# Patient Record
Sex: Male | Born: 1935 | Race: White | Hispanic: No | Marital: Married | Smoking: Never smoker
Health system: Southern US, Community
[De-identification: ages and names within clinical notes are randomized; demographics above are authoritative.]

## PROBLEM LIST (undated history)

## (undated) ENCOUNTER — Emergency Department: Payer: Medicare PPO

## (undated) DIAGNOSIS — N400 Enlarged prostate without lower urinary tract symptoms: Secondary | ICD-10-CM

## (undated) DIAGNOSIS — I2510 Atherosclerotic heart disease of native coronary artery without angina pectoris: Secondary | ICD-10-CM

## (undated) DIAGNOSIS — F419 Anxiety disorder, unspecified: Secondary | ICD-10-CM

## (undated) DIAGNOSIS — N481 Balanitis: Secondary | ICD-10-CM

## (undated) DIAGNOSIS — M1990 Unspecified osteoarthritis, unspecified site: Secondary | ICD-10-CM

## (undated) DIAGNOSIS — R3129 Other microscopic hematuria: Secondary | ICD-10-CM

## (undated) DIAGNOSIS — R319 Hematuria, unspecified: Secondary | ICD-10-CM

## (undated) DIAGNOSIS — I10 Essential (primary) hypertension: Secondary | ICD-10-CM

## (undated) DIAGNOSIS — N200 Calculus of kidney: Secondary | ICD-10-CM

## (undated) DIAGNOSIS — N529 Male erectile dysfunction, unspecified: Secondary | ICD-10-CM

## (undated) DIAGNOSIS — Z951 Presence of aortocoronary bypass graft: Secondary | ICD-10-CM

## (undated) DIAGNOSIS — E669 Obesity, unspecified: Secondary | ICD-10-CM

## (undated) DIAGNOSIS — K219 Gastro-esophageal reflux disease without esophagitis: Secondary | ICD-10-CM

## (undated) DIAGNOSIS — H9190 Unspecified hearing loss, unspecified ear: Secondary | ICD-10-CM

## (undated) DIAGNOSIS — R3915 Urgency of urination: Secondary | ICD-10-CM

## (undated) DIAGNOSIS — E291 Testicular hypofunction: Secondary | ICD-10-CM

## (undated) HISTORY — DX: Hematuria, unspecified: R31.9

## (undated) HISTORY — DX: Calculus of kidney: N20.0

## (undated) HISTORY — DX: Benign prostatic hyperplasia without lower urinary tract symptoms: N40.0

## (undated) HISTORY — DX: Testicular hypofunction: E29.1

## (undated) HISTORY — DX: Other microscopic hematuria: R31.29

## (undated) HISTORY — DX: Male erectile dysfunction, unspecified: N52.9

## (undated) HISTORY — DX: Balanitis: N48.1

## (undated) HISTORY — DX: Urgency of urination: R39.15

## (undated) HISTORY — DX: Obesity, unspecified: E66.9

## (undated) HISTORY — PX: LITHOTRIPSY: SUR834

## (undated) HISTORY — DX: Atherosclerotic heart disease of native coronary artery without angina pectoris: I25.10

---

## 2004-08-13 ENCOUNTER — Ambulatory Visit: Payer: Self-pay | Admitting: Internal Medicine

## 2005-04-30 ENCOUNTER — Ambulatory Visit: Payer: Self-pay | Admitting: Internal Medicine

## 2005-05-13 ENCOUNTER — Ambulatory Visit: Payer: Self-pay | Admitting: Cardiovascular Disease

## 2006-10-06 ENCOUNTER — Ambulatory Visit: Payer: Self-pay | Admitting: Gastroenterology

## 2007-06-04 ENCOUNTER — Encounter: Admission: RE | Admit: 2007-06-04 | Discharge: 2007-06-04 | Payer: Self-pay | Admitting: Family Medicine

## 2008-10-27 ENCOUNTER — Ambulatory Visit: Payer: Self-pay | Admitting: Gastroenterology

## 2009-09-21 HISTORY — PX: CORONARY ARTERY BYPASS GRAFT: SHX141

## 2010-06-25 ENCOUNTER — Ambulatory Visit: Payer: Self-pay | Admitting: Cardiovascular Disease

## 2010-09-18 ENCOUNTER — Encounter: Payer: Self-pay | Admitting: Cardiovascular Disease

## 2010-09-21 ENCOUNTER — Encounter: Payer: Self-pay | Admitting: Cardiovascular Disease

## 2010-10-22 ENCOUNTER — Encounter: Payer: Self-pay | Admitting: Cardiovascular Disease

## 2010-10-27 ENCOUNTER — Observation Stay: Payer: Self-pay | Admitting: Internal Medicine

## 2010-11-20 ENCOUNTER — Encounter: Payer: Self-pay | Admitting: Cardiovascular Disease

## 2010-12-21 ENCOUNTER — Encounter: Payer: Self-pay | Admitting: Cardiovascular Disease

## 2014-01-25 ENCOUNTER — Ambulatory Visit: Payer: Self-pay | Admitting: Urology

## 2014-01-29 ENCOUNTER — Ambulatory Visit: Payer: Self-pay | Admitting: Urology

## 2014-02-20 ENCOUNTER — Ambulatory Visit: Payer: Self-pay | Admitting: Urology

## 2014-03-27 ENCOUNTER — Ambulatory Visit: Payer: Self-pay | Admitting: Urology

## 2014-05-02 ENCOUNTER — Ambulatory Visit: Payer: Self-pay | Admitting: Urology

## 2014-05-02 LAB — BASIC METABOLIC PANEL
Anion Gap: 6 — ABNORMAL LOW (ref 7–16)
BUN: 18 mg/dL (ref 7–18)
CALCIUM: 8.6 mg/dL (ref 8.5–10.1)
CREATININE: 1.33 mg/dL — AB (ref 0.60–1.30)
Chloride: 107 mmol/L (ref 98–107)
Co2: 28 mmol/L (ref 21–32)
EGFR (Non-African Amer.): 51 — ABNORMAL LOW
GFR CALC AF AMER: 59 — AB
Glucose: 95 mg/dL (ref 65–99)
Osmolality: 283 (ref 275–301)
Potassium: 4.1 mmol/L (ref 3.5–5.1)
SODIUM: 141 mmol/L (ref 136–145)

## 2014-05-02 LAB — CBC
HCT: 41.6 % (ref 40.0–52.0)
HGB: 13.9 g/dL (ref 13.0–18.0)
MCH: 31.3 pg (ref 26.0–34.0)
MCHC: 33.3 g/dL (ref 32.0–36.0)
MCV: 94 fL (ref 80–100)
Platelet: 146 10*3/uL — ABNORMAL LOW (ref 150–440)
RBC: 4.43 10*6/uL (ref 4.40–5.90)
RDW: 14.7 % — ABNORMAL HIGH (ref 11.5–14.5)
WBC: 6.1 10*3/uL (ref 3.8–10.6)

## 2014-05-14 ENCOUNTER — Ambulatory Visit: Payer: Self-pay | Admitting: Urology

## 2014-08-13 ENCOUNTER — Emergency Department: Payer: Self-pay | Admitting: Emergency Medicine

## 2015-01-12 NOTE — Op Note (Signed)
PATIENT NAME:  Jeffery Ward, Menashe L MR#:  [id number] DATE OF BIRTH:  [birth year]  DATE OF PROCEDURE:  05/14/2014  SURGEON: Gerlene Burdockichard D. Edwyna ShellHart, DO.   PREOPERATIVE DIAGNOSIS:  Distal right ureteral calculus.   POSTOPERATIVE DIAGNOSIS: Distal right ureteral calculus.   PROCEDURE:  1. Cystoscopy. 2. Right ureteroscopy.  3. Laser lithotripsy.  4. Calculus removal.   COMPLICATIONS: None.   ANESTHESIA: General.   SPECIMEN: Calculus fragment.  DESCRIPTION OF PROCEDURE: With the patient sterilely prepped and draped in supine lithotomy position for ease of approach to the external genitalia and after an appropriate timeout, I begin the procedure. Cystoscopy is done. 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 There is no lateral lobe hypertrophy of the prostate. Then the bladder itself shows quite a bit of trabeculation. The ureters however, are easily seen in a normal position on the interureteric ridge and they are normal in configuration. The right ureter is seen with fluoroscopy to have a distal calculus present. It was seen on CAT scan. So, I instrument it with a 0.035 Glidewire easily past the calculus. Then do not have to do a retrograde pyelogram. I do a cystoscopy and there in the distal ureter is a rounded 3, 5 or 4 mm calculus. There is no impaction. So, I gently eased the calculus back into the dilated portion of the ureter, and then disintegrated it. The largest fragment was removed with a 4-wire Nitinol basket without difficulty.   The patient tolerated the procedure well. There is minimal bleeding, even though he has been on chronic Plavix; although, off it now for the last week. He has also been on Aleve as a substitute for his Plavix. He will begin his Plavix again in 48 hours. Bladder is emptied and 30 mL of 0.5% Marcaine are put in through the 22 French cystoscope sheath. The Marcaine is 30 mL of 0.5% plain  Marcaine. The B and O suppository is placed in the rectum. Rectal exam reveals no tumors, masses, gross nodularity of the prostate or rectal masses    ____________________________ Caralyn Guileichard D. Edwyna ShellHart, DO rdh:NT D: 05/14/2014 09:29:47 ET T: 05/14/2014 16:51:32 ET JOB#: [id number]  cc: Caralyn Guileichard D. Edwyna ShellHart, DO, <Dictator> Amarilys Lyles D Marrianne Sica DO ELECTRONICALLY SIGNED 05/18/2014 14:15

## 2015-05-31 ENCOUNTER — Other Ambulatory Visit: Payer: Self-pay

## 2015-05-31 DIAGNOSIS — E291 Testicular hypofunction: Secondary | ICD-10-CM

## 2015-06-01 LAB — TESTOSTERONE: TESTOSTERONE: 199 ng/dL — AB (ref 348–1197)

## 2015-06-03 ENCOUNTER — Telehealth: Payer: Self-pay | Admitting: *Deleted

## 2015-06-03 NOTE — Telephone Encounter (Signed)
Spoke with patient to tell him that i had received his approval for Testopel. Patient was scheduled an appointment for procedure for Sept. 16 at 10:45 am. Patient states that was fine.

## 2015-06-06 ENCOUNTER — Encounter: Payer: Self-pay | Admitting: *Deleted

## 2015-06-06 ENCOUNTER — Other Ambulatory Visit: Payer: Self-pay | Admitting: *Deleted

## 2015-06-07 ENCOUNTER — Encounter: Payer: Self-pay | Admitting: Urology

## 2015-06-07 ENCOUNTER — Ambulatory Visit (INDEPENDENT_AMBULATORY_CARE_PROVIDER_SITE_OTHER): Payer: Medicare Other | Admitting: Urology

## 2015-06-07 VITALS — BP 133/74 | HR 50 | Ht 68.0 in | Wt 232.3 lb

## 2015-06-07 DIAGNOSIS — N401 Enlarged prostate with lower urinary tract symptoms: Secondary | ICD-10-CM | POA: Diagnosis not present

## 2015-06-07 DIAGNOSIS — E291 Testicular hypofunction: Secondary | ICD-10-CM | POA: Diagnosis not present

## 2015-06-07 DIAGNOSIS — N138 Other obstructive and reflux uropathy: Secondary | ICD-10-CM

## 2015-06-07 NOTE — Progress Notes (Signed)
06/07/2015 4:02 PM   [name] [birth year] [id number]  Referring provider: No referring provider defined for this encounter.  Chief Complaint  Patient presents with  . Hypogonadism    follow up    HPI: This is 81 year old white male with a history of hypogonadism and BPH with LUTS who presents today for 6 month follow-up.  BPH WITH LUTS His IPSS score today is 16, which is moderate lower urinary tract symptomatology. He is mixed with his quality life due to his urinary symptoms. His major complaint today frequency and urgency.  He has had these symptoms for many years.  He denies any dysuria, hematuria or suprapubic pain.  He also denies any recent fevers, chills, nausea or vomiting.  He does not have a family history of PCa.      IPSS      06/07/15 1100       International Prostate Symptom Score   How often have you had the sensation of not emptying your bladder? Not at All     How often have you had to urinate less than every two hours? Almost always     How often have you found you stopped and started again several times when you urinated? About half the time     How often have you found it difficult to postpone urination? Almost always     How often have you had a weak urinary stream? Less than 1 in 5 times     How often have you had to strain to start urination? Not at All     How many times did you typically get up at night to urinate? 2 Times     Total IPSS Score 16     Quality of Life due to urinary symptoms   If you were to spend the rest of your life with your urinary condition just the way it is now how would you feel about that? Mixed        Score:  1-7 Mild 8-19 Moderate 20-35 Severe  Hypogonadism Patient is  with the symptoms of reduced libido and erectile dysfunction.  He is also experiencing a reduced incidence of spontaneous erections, a reduced need to shave, an increase of body fat, a decrease in physical and work performance, disturbances in  sleep patterns, decreased energy and motivation.  This is indicated by his responses to the ADAM questionnaire.      Androgen Deficiency in the Aging Male      06/07/15 1100       Androgen Deficiency in the Aging Male   Do you have a decrease in libido (sex drive) Yes     Do you have lack of energy Yes     Do you have a decrease in strength and/or endurance Yes     Have you lost height Yes     Have you noticed a decreased "enjoyment of life" No     Are you sad and/or grumpy No     Are your erections less strong Yes     Have you noticed a recent deterioration in your ability to play sports Yes     Are you falling asleep after dinner Yes     Has there been a recent deterioration in your work performance No         His pretreatment testosterone level was 186 ng/dL on 40/98/1191.   His hypogonadism is currently being managed by Testopel insertion.     PMH: Past Medical History  Diagnosis Date  . Coronary artery arteriosclerosis   . Erectile dysfunction   . Hematuria   . Hypogonadism in male   . BPH without obstruction/lower urinary tract symptoms   . Microscopic hematuria   . Balanitis   . Obesity   . Urinary urgency   . Kidney stones     Surgical History: Past Surgical History  Procedure Laterality Date  . Coronary artery bypass graft  2011    Home Medications:    Medication List       This list is accurate as of: 06/07/15 11:59 PM.  Always use your most recent med list.               aspirin 325 MG tablet  Take by mouth.     clopidogrel 75 MG tablet  Commonly known as:  PLAVIX     fluticasone 50 MCG/ACT nasal spray  Commonly known as:  FLONASE     metoprolol succinate 50 MG 24 hr tablet  Commonly known as:  TOPROL-XL     metoprolol tartrate 25 MG tablet  Commonly known as:  LOPRESSOR     oxyCODONE 5 MG immediate release tablet  Commonly known as:  Oxy IR/ROXICODONE     pantoprazole 40 MG tablet  Commonly known as:  PROTONIX  Take by mouth.       rosuvastatin 20 MG tablet  Commonly known as:  CRESTOR  Take by mouth.     telmisartan 20 MG tablet  Commonly known as:  MICARDIS  Take by mouth.     TESTOPEL 75 MG Pllt  Generic drug:  Testosterone  75 mg by Implant route every 3 (three) months.        Allergies:  Allergies  Allergen Reactions  . Amoxicillin Other (See Comments)  . Azithromycin Diarrhea  . Penicillins Other (See Comments)    Family History: Family History  Problem Relation Age of Onset  . Kidney disease Neg Hx   . Prostate cancer Neg Hx   . Bladder Cancer Neg Hx     Social History:  reports that he has never smoked. He does not have any smokeless tobacco history on file. He reports that he does not drink alcohol or use illicit drugs.  ROS: UROLOGY Frequent Urination?: Yes Hard to postpone urination?: Yes Burning/pain with urination?: No Get up at night to urinate?: No Leakage of urine?: No Urine stream starts and stops?: No Trouble starting stream?: No Do you have to strain to urinate?: No Blood in urine?: No Urinary tract infection?: No Sexually transmitted disease?: No Injury to kidneys or bladder?: No Painful intercourse?: No Weak stream?: No Erection problems?: No Penile pain?: No  Gastrointestinal Nausea?: No Vomiting?: No Indigestion/heartburn?: No Diarrhea?: No Constipation?: No  Constitutional Fever: No Night sweats?: No Weight loss?: No Fatigue?: Yes  Skin Skin rash/lesions?: No Itching?: No  Eyes Blurred vision?: No Double vision?: No  Ears/Nose/Throat Sore throat?: No Sinus problems?: No  Hematologic/Lymphatic Swollen glands?: No Easy bruising?: No  Cardiovascular Leg swelling?: No Chest pain?: No  Respiratory Cough?: No Shortness of breath?: No  Endocrine Excessive thirst?: No  Musculoskeletal Back pain?: No Joint pain?: No  Neurological Headaches?: No Dizziness?: No  Psychologic Depression?: No Anxiety?: No  Physical Exam: BP  133/74 mmHg  Pulse 50  Ht  (1.727 m)  Wt 232 lb 4.8 oz (105.371 kg)  BMI 35.33 kg/m2  GU: Patient with circumcised phallus.   Urethral meatus is patent.  No penile discharge. No penile  lesions or rashes. Scrotum without lesions, cysts, rashes and/or edema.  Testicles are located scrotally bilaterally. No masses are appreciated in the testicles. Left and right epididymis are normal. Rectal: Patient with  normal sphincter tone. Perineum without scarring or rashes. No rectal masses are appreciated. Prostate is approximately 45 grams, no nodules are appreciated. Seminal vesicles are normal.   Laboratory Data: Lab Results  Component Value Date   WBC 6.1 05/02/2014   HGB 13.9 05/02/2014   HCT 41.5 06/07/2015   MCV 94 05/02/2014   PLT 146* 05/02/2014    Lab Results  Component Value Date   CREATININE 1.33* 05/02/2014   PSA History:  1.6 ng/mL on 11/01/2012  1.0 ng/mL on 03/22/2013  1.9 ng/mL on 10/10/2013  1.4 ng/mL on 01/16/2014    1.3 ng/mL on 11/15/2014  Lab Results  Component Value Date   PSA 1.2 06/07/2015    Lab Results  Component Value Date   TESTOSTERONE 199* 05/31/2015    Assessment & Plan:    1. Hypogonadism in male:   Patient's hypogonadism is managed with Testopel insertions. He'll be receiving 6 pellets every 3 months. His next insertion is due next week. He will have his hematocrit and has serum testosterone drawn before 9 AM every 6 months.  - Hematocrit  2. BPH with LUTS:   Patient's IPSS score is 16/3.  His DRE demonstrates benign enlargement.  I discussed the treatment options for BPH, such as: observation over time with prn avoidance of alcohol/caffeine; medical treatment or TURP.  Patient would like to continue with observation at this time.   He will follow up in 6 months for a PSA, DRE and an IPSS score.   - PSA    No Follow-up on file.  Michiel Cowboy, PA-C  [facility] [address], [address] [place], [place] [address] [phone]

## 2015-06-08 LAB — HEMATOCRIT: HEMATOCRIT: 41.5 % (ref 37.5–51.0)

## 2015-06-08 LAB — PSA: PROSTATE SPECIFIC AG, SERUM: 1.2 ng/mL (ref 0.0–4.0)

## 2015-06-09 ENCOUNTER — Encounter: Payer: Self-pay | Admitting: Urology

## 2015-06-09 DIAGNOSIS — N401 Enlarged prostate with lower urinary tract symptoms: Secondary | ICD-10-CM | POA: Insufficient documentation

## 2015-06-09 DIAGNOSIS — N138 Other obstructive and reflux uropathy: Secondary | ICD-10-CM | POA: Insufficient documentation

## 2015-06-09 DIAGNOSIS — E291 Testicular hypofunction: Secondary | ICD-10-CM | POA: Insufficient documentation

## 2015-06-10 ENCOUNTER — Telehealth: Payer: Self-pay

## 2015-06-10 NOTE — Telephone Encounter (Signed)
-----   Message from Harle Battiest, PA-C sent at 06/09/2015  4:19 PM EDT ----- Labs are stable. Proceed with Testopel.

## 2015-06-11 ENCOUNTER — Encounter: Payer: Self-pay | Admitting: Urology

## 2015-06-11 ENCOUNTER — Ambulatory Visit (INDEPENDENT_AMBULATORY_CARE_PROVIDER_SITE_OTHER): Payer: Medicare Other | Admitting: Urology

## 2015-06-11 VITALS — BP 117/63 | HR 62 | Ht 68.0 in | Wt 233.8 lb

## 2015-06-11 DIAGNOSIS — E291 Testicular hypofunction: Secondary | ICD-10-CM

## 2015-06-11 MED ORDER — TESTOSTERONE 75 MG IL PLLT
75.0000 mg | PELLET | Freq: Once | Status: AC
  Administered 2015-06-11: 75 mg

## 2015-06-11 NOTE — Progress Notes (Signed)
This is a 81 -year-old male with hypogonadism and he is managed with Testopel. He presents today for Testopel insertion.  Patient is placed on the exam table in the left lateral jackknife position.  Identified upper outer quadrant of hip for insertion; prepped area with Betadine and injected 15 cc's of Lidocaine 1% with Epinephrine to anesthetize superficially and distally along trocar tract.  Made 3 mm incision using 15 blade of scalpel; trocar with sharp ended stylet was inserted into subcutaneous tissue in line with femur. Sharp stylet was withdrawn and 6 pellets were placed into trocar well. Testopel pellets advanced into tissue using blunt ended stylet. Trocar removed and incision closed using 6 Steri-Strips. Cleansed area to remove Betadine and covered Steri-Strips with outer Band-Aid.  Careful inspection of insertion is done and patient informed of post procedure instructions.  He will return in three month for serum testosterone before 9:00am.

## 2015-07-02 ENCOUNTER — Telehealth: Payer: Self-pay | Admitting: *Deleted

## 2015-07-02 NOTE — Telephone Encounter (Signed)
LMOM for patient to call back, I have info for him about the Testopel billing and the bill he received from his insurance.

## 2015-07-05 NOTE — Telephone Encounter (Signed)
Spoke with patient about codes for Testopel. Same codes as always used may be more specific but his insurance company approved and agreed those codes and he needs to contact insurance company back and I let him know I would be glad to talk to them again if needed. Patient states he will call on Monday.

## 2015-09-04 ENCOUNTER — Other Ambulatory Visit: Payer: Medicare Other

## 2015-09-10 ENCOUNTER — Ambulatory Visit (INDEPENDENT_AMBULATORY_CARE_PROVIDER_SITE_OTHER): Payer: Medicare Other | Admitting: Urology

## 2015-09-10 ENCOUNTER — Encounter: Payer: Self-pay | Admitting: Urology

## 2015-09-10 VITALS — BP 169/82 | HR 45 | Ht 68.0 in | Wt 239.4 lb

## 2015-09-10 DIAGNOSIS — E291 Testicular hypofunction: Secondary | ICD-10-CM

## 2015-09-10 NOTE — Progress Notes (Signed)
9:43 AM   Jeffery Ward [birth year] [id number]  Referring provider: No referring provider defined for this encounter.  Chief Complaint  Patient presents with  . Hypogonadism    follow up   HPI: This is 81 year old white male with a history of hypogonadism who presents today for a Testopel insertion, but he received a large bill for the insertion services and he does not want to continue this mode of therapy.  He would like to discuss other options for testosterone replacement.  Hypogonadism Patient is  with the symptoms of reduced libido and erectile dysfunction.  He is also experiencing a reduced incidence of spontaneous erections, a reduced need to shave, an increase of body fat, a decrease in physical and work performance, disturbances in sleep patterns, decreased energy and motivation.  His pretreatment testosterone level was 186 ng/dL on 63/87/564309/24/2013.   His hypogonadism is currently being managed by Testopel insertion.  He has been unhappy with this mode of therapy that only because of his bills, but he also did not like the peaks and troughs experienced while on the insertions.   PMH: Past Medical History  Diagnosis Date  . Coronary artery arteriosclerosis   . Erectile dysfunction   . Hematuria   . Hypogonadism in male   . BPH without obstruction/lower urinary tract symptoms   . Microscopic hematuria   . Balanitis   . Obesity   . Urinary urgency   . Kidney stones     Surgical History: Past Surgical History  Procedure Laterality Date  . Coronary artery bypass graft  2011  . Lithotripsy      Home Medications:    Medication List       This list is accurate as of: 09/10/15  9:43 AM.  Always use your most recent med list.               aspirin 81 MG EC tablet  Take 81 mg by mouth daily. Swallow whole.     clopidogrel 75 MG tablet  Commonly known as:  PLAVIX     fluticasone 50 MCG/ACT nasal spray  Commonly known as:  FLONASE     metoprolol succinate  50 MG 24 hr tablet  Commonly known as:  TOPROL-XL  Reported on 09/10/2015     metoprolol tartrate 25 MG tablet  Commonly known as:  LOPRESSOR     neomycin-polymyxin b-dexamethasone 3.5-10000-0.1 Susp  Commonly known as:  MAXITROL  Reported on 09/10/2015     oxyCODONE 5 MG immediate release tablet  Commonly known as:  Oxy IR/ROXICODONE  Reported on 09/10/2015     pantoprazole 40 MG tablet  Commonly known as:  PROTONIX  Take by mouth.     rosuvastatin 20 MG tablet  Commonly known as:  CRESTOR  Take by mouth.     telmisartan 20 MG tablet  Commonly known as:  MICARDIS  Take by mouth.     terbinafine 250 MG tablet  Commonly known as:  LAMISIL     TESTOPEL 75 MG Pllt  Generic drug:  Testosterone  75 mg by Implant route every 3 (three) months.        Allergies:  Allergies  Allergen Reactions  . Amoxicillin Other (See Comments)  . Azithromycin Diarrhea  . Penicillins Other (See Comments)    Family History: Family History  Problem Relation Age of Onset  . Kidney disease Neg Hx   . Prostate cancer Neg Hx   . Bladder Cancer Neg Hx  Social History:  reports that he has never smoked. He does not have any smokeless tobacco history on file. He reports that he does not drink alcohol or use illicit drugs.  ROS: UROLOGY Frequent Urination?: Yes Hard to postpone urination?: Yes Burning/pain with urination?: No Get up at night to urinate?: Yes Leakage of urine?: No Urine stream starts and stops?: No Trouble starting stream?: No Do you have to strain to urinate?: No Blood in urine?: No Urinary tract infection?: No Sexually transmitted disease?: No Injury to kidneys or bladder?: No Painful intercourse?: No Weak stream?: No Erection problems?: No Penile pain?: No  Gastrointestinal Nausea?: No Vomiting?: No Indigestion/heartburn?: No Diarrhea?: No Constipation?: No  Constitutional Fever: No Night sweats?: No Weight loss?: No Fatigue?: No  Skin Skin  rash/lesions?: No Itching?: No  Eyes Blurred vision?: No Double vision?: No  Ears/Nose/Throat Sore throat?: No Sinus problems?: Yes  Hematologic/Lymphatic Swollen glands?: No Easy bruising?: No  Cardiovascular Leg swelling?: No Chest pain?: No  Respiratory Cough?: No Shortness of breath?: No  Endocrine Excessive thirst?: No  Musculoskeletal Back pain?: Yes Joint pain?: Yes  Neurological Headaches?: Yes Dizziness?: No  Psychologic Depression?: Yes Anxiety?: No  Physical Exam: BP 169/82 mmHg  Pulse 45  Ht  (1.727 m)  Wt 239 lb 6.4 oz (108.591 kg)  BMI 36.41 kg/m2  Constitutional: Well nourished. Alert and oriented, No acute distress. HEENT: [place] AT, moist mucus membranes. Trachea midline, no masses. Cardiovascular: No clubbing, cyanosis, or edema. Respiratory: Normal respiratory effort, no increased work of breathing. Skin: No rashes, bruises or suspicious lesions. Lymph: No cervical or inguinal adenopathy. Neurologic: Grossly intact, no focal deficits, moving all 4 extremities. Psychiatric: Normal mood and affect.   Laboratory Data:  PSA History:  1.6 ng/mL on 11/01/2012  1.0 ng/mL on 03/22/2013  1.9 ng/mL on 10/10/2013  1.4 ng/mL on 01/16/2014    1.3 ng/mL on 11/15/2014  Lab Results  Component Value Date   PSA 1.2 06/07/2015    Lab Results  Component Value Date   TESTOSTERONE 199* 05/31/2015    Assessment & Plan:    1. Hypogonadism in male:   I discussed with the patient the other mode of testosterone therapies, such as gels, injections, buccal mucosal systems, nasal spray and patches.   I also discussed that some men have had success using clomid for hypogonadism.  It does seem to be more successful in younger men, but there are incidences of good results in middle-aged men.  I explained that it is used in male infertility to stimulate the testicles to make more testosterone/sperm.  There has been no long term data on side  effects, but some urologists has been having success with this medication.  He would like to try Clomid therapy.  I reemphasized the side effects of testosterone therapy, such as: enlargement of the prostate gland that may in turn cause LUTS, possible increased risk of PCa, DVT's and/or PE's, possible increased risk of heart attack or stroke, lower sperm count, swelling of the ankles, feet, or body, with or without heart failure, enlarged or painful breasts, have problems breathing while you sleep (sleep apnea), increased prostate specific antigen, mood swings, hypertension and increased red blood cell count.  If his laboratory values return within safe parameters, I will send a prescription for Clomid 50 mg one half tablet daily to his pharmacy, [facility].  - HCT - HEPATIC FUNCTION PANEL - LIPID PANEL - TESTOSTERONE - TSH  If he is initiated on Clomid therapy,  he will return in 1 month's time for a morning testosterone drawn before 9 AM.  Return for pending labs.  Michiel Cowboy, PA-C  [facility] [address], [address] [place], [place] [address] [phone]

## 2015-09-11 ENCOUNTER — Telehealth: Payer: Self-pay

## 2015-09-11 DIAGNOSIS — E291 Testicular hypofunction: Secondary | ICD-10-CM

## 2015-09-11 LAB — TSH: TSH: 2.2 u[IU]/mL (ref 0.450–4.500)

## 2015-09-11 LAB — TESTOSTERONE: TESTOSTERONE: 328 ng/dL — AB (ref 348–1197)

## 2015-09-11 LAB — HEPATIC FUNCTION PANEL
ALT: 20 IU/L (ref 0–44)
AST: 27 IU/L (ref 0–40)
Albumin: 4.3 g/dL (ref 3.5–4.7)
Alkaline Phosphatase: 40 IU/L (ref 39–117)
Bilirubin Total: 0.5 mg/dL (ref 0.0–1.2)
Bilirubin, Direct: 0.14 mg/dL (ref 0.00–0.40)
Total Protein: 6.2 g/dL (ref 6.0–8.5)

## 2015-09-11 LAB — HEMATOCRIT: HEMATOCRIT: 43.5 % (ref 37.5–51.0)

## 2015-09-11 MED ORDER — CLOMIPHENE CITRATE 50 MG PO TABS: ORAL_TABLET | ORAL | Status: DC

## 2015-09-11 NOTE — Telephone Encounter (Signed)
Spoke with pt in reference to medication and labs. Pt voiced understanding. Medication was sent to pharmacy.

## 2015-09-11 NOTE — Telephone Encounter (Signed)
-----   Message from Harle BattiestShannon A McGowan, PA-C sent at 09/11/2015  1:51 PM EST ----- Patient's labs are within normal parameters.  He can start Clomid 50 mg, 1/2 tablet daily #30.  We will need to check a morning testosterone in three months.  His pharmacy is [facility].

## 2015-09-24 ENCOUNTER — Telehealth: Payer: Self-pay | Admitting: Urology

## 2015-09-24 NOTE — Telephone Encounter (Signed)
LMOM

## 2015-09-24 NOTE — Telephone Encounter (Signed)
Patient has not had his lipids checked.  He was initiated on Clomid and will need a morning testosterone level in one month, we can draw his lipids at the same time.

## 2015-09-26 NOTE — Telephone Encounter (Signed)
Jeffery Ward spoke with pt.

## 2015-10-01 ENCOUNTER — Other Ambulatory Visit: Payer: Medicare Other

## 2015-10-03 ENCOUNTER — Other Ambulatory Visit: Payer: Medicare Other

## 2015-10-03 DIAGNOSIS — E291 Testicular hypofunction: Secondary | ICD-10-CM

## 2015-10-04 ENCOUNTER — Telehealth: Payer: Self-pay

## 2015-10-04 LAB — LIPID PANEL
CHOL/HDL RATIO: 2.5 ratio (ref 0.0–5.0)
Cholesterol, Total: 134 mg/dL (ref 100–199)
HDL: 54 mg/dL (ref >39)
LDL CALC: 66 mg/dL (ref 0–99)
Triglycerides: 69 mg/dL (ref 0–149)
VLDL Cholesterol Cal: 14 mg/dL (ref 5–40)

## 2015-10-04 LAB — TESTOSTERONE: TESTOSTERONE: 282 ng/dL — AB (ref 348–1197)

## 2015-10-04 NOTE — Telephone Encounter (Signed)
Spoke with pt wife and made aware of lab results.

## 2015-10-04 NOTE — Telephone Encounter (Signed)
-----   Message from Harle BattiestShannon A McGowan, PA-C sent at 10/04/2015  1:03 PM EST ----- Lipids are good.  Still waiting on his testosterone results.

## 2015-10-10 ENCOUNTER — Telehealth: Payer: Self-pay

## 2015-10-10 DIAGNOSIS — E291 Testicular hypofunction: Secondary | ICD-10-CM

## 2015-10-10 NOTE — Telephone Encounter (Signed)
I suggest to continue the Clomid and rechecking his testosterone in the AM in one month.

## 2015-10-10 NOTE — Telephone Encounter (Signed)
Spoke with pt who stated he is still taking his clomid.

## 2015-10-10 NOTE — Telephone Encounter (Signed)
Left message with caregiver to pt to return call.

## 2015-10-10 NOTE — Telephone Encounter (Signed)
-----   Message from Harle Battiest, PA-C sent at 10/09/2015  9:34 PM EST ----- Is patient taking Clomid?

## 2015-10-11 NOTE — Telephone Encounter (Signed)
Spoke with pt and made aware to continue medication and recheck testosterone in 88mo. Pt voiced understanding.

## 2015-11-26 ENCOUNTER — Telehealth: Payer: Self-pay

## 2015-11-26 NOTE — Telephone Encounter (Signed)
Pt pharmacy sent a refill request of clomid. Noted pt has not has labs drawn. Spoke with pt in reference to medication. Pt stated that he is currently disputing his insurance claims with insurance company and his insurance company rep advised him not to get anymore treatment until the claim has been completed. Reinforced with pt not able to refill medication until labs are completed. Pt voiced understanding stating he will call for an appt when he completes the claim.

## 2016-01-15 ENCOUNTER — Telehealth: Payer: Self-pay | Admitting: Urology

## 2016-01-15 NOTE — Telephone Encounter (Signed)
Mr. Mechele Collinlliott called saying [facility] and they informed him the code that was filed for his last Testopel appointment was S0189. The code needs to be changed to J3490 and refiled. Per the representative, [employer] will then reimburse him for the $1,045 he paid. Per the representative, the original filing code didn't include the medication. The phone number he gave for [facility] is: [phone]. Please call the patient if you have questions or concerns.   Pt's ph# [phone] Thank you.

## 2016-01-16 NOTE — Telephone Encounter (Signed)
This is a question for TEPPCO PartnersMichelle or Lisa.

## 2016-01-17 NOTE — Telephone Encounter (Signed)
Can you help?

## 2016-06-01 ENCOUNTER — Ambulatory Visit
Admission: RE | Admit: 2016-06-01 | Discharge: 2016-06-01 | Disposition: A | Discharge status: Home / Self Care | Payer: Medicare Other | Source: Ambulatory Visit | Attending: Chiropractor | Admitting: Chiropractor

## 2016-06-01 ENCOUNTER — Other Ambulatory Visit: Payer: Self-pay | Admitting: Chiropractor

## 2016-06-01 DIAGNOSIS — R42 Dizziness and giddiness: Secondary | ICD-10-CM | POA: Diagnosis present

## 2016-06-01 DIAGNOSIS — M47894 Other spondylosis, thoracic region: Secondary | ICD-10-CM | POA: Diagnosis not present

## 2016-06-01 DIAGNOSIS — R51 Headache: Secondary | ICD-10-CM | POA: Diagnosis present

## 2016-06-01 DIAGNOSIS — M4184 Other forms of scoliosis, thoracic region: Secondary | ICD-10-CM | POA: Diagnosis not present

## 2019-02-02 ENCOUNTER — Other Ambulatory Visit: Payer: Self-pay | Admitting: Unknown Physician Specialty

## 2019-02-02 ENCOUNTER — Ambulatory Visit
Admission: RE | Admit: 2019-02-02 | Discharge: 2019-02-02 | Disposition: A | Discharge status: Home / Self Care | Payer: Medicare Other | Source: Ambulatory Visit | Attending: Unknown Physician Specialty | Admitting: Unknown Physician Specialty

## 2019-02-02 ENCOUNTER — Other Ambulatory Visit: Payer: Self-pay

## 2019-02-02 DIAGNOSIS — R05 Cough: Secondary | ICD-10-CM | POA: Insufficient documentation

## 2019-02-02 DIAGNOSIS — R059 Cough, unspecified: Secondary | ICD-10-CM

## 2019-02-07 ENCOUNTER — Institutional Professional Consult (permissible substitution): Payer: Medicare Other | Admitting: Internal Medicine

## 2019-02-09 ENCOUNTER — Institutional Professional Consult (permissible substitution): Payer: Medicare Other | Admitting: Internal Medicine

## 2019-06-08 DIAGNOSIS — M4316 Spondylolisthesis, lumbar region: Secondary | ICD-10-CM | POA: Insufficient documentation

## 2021-09-11 ENCOUNTER — Other Ambulatory Visit: Payer: Self-pay | Admitting: Family Medicine

## 2021-09-11 DIAGNOSIS — M542 Cervicalgia: Secondary | ICD-10-CM

## 2021-09-11 DIAGNOSIS — W19XXXA Unspecified fall, initial encounter: Secondary | ICD-10-CM

## 2021-09-12 ENCOUNTER — Other Ambulatory Visit: Payer: Self-pay

## 2021-09-12 ENCOUNTER — Ambulatory Visit
Admission: RE | Admit: 2021-09-12 | Discharge: 2021-09-12 | Disposition: A | Discharge status: Home / Self Care | Payer: Medicare PPO | Source: Ambulatory Visit | Attending: Family Medicine | Admitting: Family Medicine

## 2021-09-12 DIAGNOSIS — M542 Cervicalgia: Secondary | ICD-10-CM | POA: Diagnosis present

## 2021-09-12 DIAGNOSIS — W19XXXA Unspecified fall, initial encounter: Secondary | ICD-10-CM

## 2021-09-12 DIAGNOSIS — S0990XA Unspecified injury of head, initial encounter: Secondary | ICD-10-CM | POA: Insufficient documentation

## 2021-09-12 DIAGNOSIS — M5030 Other cervical disc degeneration, unspecified cervical region: Secondary | ICD-10-CM | POA: Diagnosis not present

## 2021-09-12 DIAGNOSIS — M47812 Spondylosis without myelopathy or radiculopathy, cervical region: Secondary | ICD-10-CM | POA: Insufficient documentation

## 2021-11-11 ENCOUNTER — Encounter
Admission: EM | Disposition: A | Discharge status: Home / Self Care | Payer: Self-pay | Source: Home / Self Care | Attending: Student in an Organized Health Care Education/Training Program

## 2021-11-11 ENCOUNTER — Other Ambulatory Visit: Payer: Self-pay

## 2021-11-11 ENCOUNTER — Encounter: Payer: Self-pay | Admitting: Emergency Medicine

## 2021-11-11 ENCOUNTER — Observation Stay: Payer: Medicare PPO | Admitting: Anesthesiology

## 2021-11-11 ENCOUNTER — Emergency Department: Payer: Medicare PPO

## 2021-11-11 ENCOUNTER — Observation Stay: Payer: Medicare PPO

## 2021-11-11 ENCOUNTER — Observation Stay
Admission: EM | Admit: 2021-11-11 | Discharge: 2021-11-13 | Disposition: A | Discharge status: Home / Self Care | Payer: Medicare PPO | Attending: Internal Medicine | Admitting: Internal Medicine

## 2021-11-11 DIAGNOSIS — E8729 Other acidosis: Secondary | ICD-10-CM | POA: Diagnosis not present

## 2021-11-11 DIAGNOSIS — N200 Calculus of kidney: Secondary | ICD-10-CM | POA: Diagnosis not present

## 2021-11-11 DIAGNOSIS — Z951 Presence of aortocoronary bypass graft: Secondary | ICD-10-CM | POA: Insufficient documentation

## 2021-11-11 DIAGNOSIS — N210 Calculus in bladder: Secondary | ICD-10-CM

## 2021-11-11 DIAGNOSIS — N201 Calculus of ureter: Secondary | ICD-10-CM | POA: Diagnosis not present

## 2021-11-11 DIAGNOSIS — K219 Gastro-esophageal reflux disease without esophagitis: Secondary | ICD-10-CM | POA: Diagnosis present

## 2021-11-11 DIAGNOSIS — I2510 Atherosclerotic heart disease of native coronary artery without angina pectoris: Secondary | ICD-10-CM | POA: Insufficient documentation

## 2021-11-11 DIAGNOSIS — N138 Other obstructive and reflux uropathy: Secondary | ICD-10-CM

## 2021-11-11 DIAGNOSIS — N132 Hydronephrosis with renal and ureteral calculous obstruction: Secondary | ICD-10-CM | POA: Diagnosis not present

## 2021-11-11 DIAGNOSIS — R2681 Unsteadiness on feet: Secondary | ICD-10-CM | POA: Insufficient documentation

## 2021-11-11 DIAGNOSIS — E860 Dehydration: Secondary | ICD-10-CM | POA: Insufficient documentation

## 2021-11-11 DIAGNOSIS — E872 Acidosis, unspecified: Secondary | ICD-10-CM | POA: Diagnosis present

## 2021-11-11 DIAGNOSIS — N1330 Unspecified hydronephrosis: Secondary | ICD-10-CM

## 2021-11-11 DIAGNOSIS — N134 Hydroureter: Secondary | ICD-10-CM | POA: Diagnosis not present

## 2021-11-11 DIAGNOSIS — N23 Unspecified renal colic: Secondary | ICD-10-CM

## 2021-11-11 DIAGNOSIS — Z7982 Long term (current) use of aspirin: Secondary | ICD-10-CM | POA: Insufficient documentation

## 2021-11-11 DIAGNOSIS — N135 Crossing vessel and stricture of ureter without hydronephrosis: Secondary | ICD-10-CM | POA: Insufficient documentation

## 2021-11-11 DIAGNOSIS — N401 Enlarged prostate with lower urinary tract symptoms: Secondary | ICD-10-CM | POA: Diagnosis not present

## 2021-11-11 DIAGNOSIS — R109 Unspecified abdominal pain: Secondary | ICD-10-CM

## 2021-11-11 DIAGNOSIS — R1032 Left lower quadrant pain: Secondary | ICD-10-CM | POA: Diagnosis present

## 2021-11-11 DIAGNOSIS — Z20822 Contact with and (suspected) exposure to covid-19: Secondary | ICD-10-CM | POA: Insufficient documentation

## 2021-11-11 HISTORY — PX: CYSTOSCOPY WITH STENT PLACEMENT: SHX5790

## 2021-11-11 LAB — COMPREHENSIVE METABOLIC PANEL
ALT: 17 U/L (ref 0–44)
AST: 27 U/L (ref 15–41)
Albumin: 4.2 g/dL (ref 3.5–5.0)
Alkaline Phosphatase: 45 U/L (ref 38–126)
Anion gap: 12 (ref 5–15)
BUN: 13 mg/dL (ref 8–23)
CO2: 20 mmol/L — ABNORMAL LOW (ref 22–32)
Calcium: 9.3 mg/dL (ref 8.9–10.3)
Chloride: 105 mmol/L (ref 98–111)
Creatinine, Ser: 1.36 mg/dL — ABNORMAL HIGH (ref 0.61–1.24)
GFR, Estimated: 51 mL/min — ABNORMAL LOW (ref >60)
Glucose, Bld: 162 mg/dL — ABNORMAL HIGH (ref 70–99)
Potassium: 3.6 mmol/L (ref 3.5–5.1)
Sodium: 137 mmol/L (ref 135–145)
Total Bilirubin: 0.7 mg/dL (ref 0.3–1.2)
Total Protein: 7.4 g/dL (ref 6.5–8.1)

## 2021-11-11 LAB — RESP PANEL BY RT-PCR (FLU A&B, COVID) ARPGX2
Influenza A by PCR: NEGATIVE
Influenza B by PCR: NEGATIVE
SARS Coronavirus 2 by RT PCR: NEGATIVE

## 2021-11-11 LAB — URINALYSIS, COMPLETE (UACMP) WITH MICROSCOPIC
Bacteria, UA: NONE SEEN
Bilirubin Urine: NEGATIVE
Glucose, UA: NEGATIVE mg/dL
Hgb urine dipstick: NEGATIVE
Ketones, ur: 5 mg/dL — AB
Nitrite: NEGATIVE
Protein, ur: 100 mg/dL — AB
Specific Gravity, Urine: 1.023 (ref 1.005–1.030)
Squamous Epithelial / HPF: NONE SEEN (ref 0–5)
pH: 6 (ref 5.0–8.0)

## 2021-11-11 LAB — CBC
HCT: 38.9 % — ABNORMAL LOW (ref 39.0–52.0)
Hemoglobin: 13 g/dL (ref 13.0–17.0)
MCH: 30.6 pg (ref 26.0–34.0)
MCHC: 33.4 g/dL (ref 30.0–36.0)
MCV: 91.5 fL (ref 80.0–100.0)
Platelets: 198 10*3/uL (ref 150–400)
RBC: 4.25 MIL/uL (ref 4.22–5.81)
RDW: 13.8 % (ref 11.5–15.5)
WBC: 5.6 10*3/uL (ref 4.0–10.5)
nRBC: 0 % (ref 0.0–0.2)

## 2021-11-11 LAB — LACTIC ACID, PLASMA: Lactic Acid, Venous: 2.7 mmol/L (ref 0.5–1.9)

## 2021-11-11 LAB — TROPONIN I (HIGH SENSITIVITY): Troponin I (High Sensitivity): 7 ng/L (ref <18)

## 2021-11-11 LAB — LIPASE, BLOOD: Lipase: 30 U/L (ref 11–51)

## 2021-11-11 SURGERY — CYSTOSCOPY, WITH STENT INSERTION
Anesthesia: General | Laterality: Left

## 2021-11-11 MED ORDER — LIDOCAINE HCL (CARDIAC) PF 100 MG/5ML IV SOSY
PREFILLED_SYRINGE | INTRAVENOUS | Status: DC | PRN
Start: 2021-11-11 — End: 2021-11-11
  Administered 2021-11-11: 100 mg via INTRAVENOUS

## 2021-11-11 MED ORDER — SODIUM CHLORIDE 0.9 % IV BOLUS: 500.0000 mL | Freq: Once | INTRAVENOUS | Status: DC

## 2021-11-11 MED ORDER — FENTANYL CITRATE (PF) 100 MCG/2ML IJ SOLN: 25.0000 ug | INTRAMUSCULAR | Status: DC | PRN

## 2021-11-11 MED ORDER — PROPOFOL 10 MG/ML IV BOLUS
INTRAVENOUS | Status: AC
  Filled 2021-11-11: qty 20

## 2021-11-11 MED ORDER — ACETAMINOPHEN 650 MG RE SUPP: 650.0000 mg | Freq: Four times a day (QID) | RECTAL | Status: DC | PRN

## 2021-11-11 MED ORDER — OXYCODONE HCL 5 MG PO TABS
5.0000 mg | ORAL_TABLET | ORAL | Status: DC | PRN
  Administered 2021-11-12 (×2): 5 mg via ORAL
  Filled 2021-11-11 (×2): qty 1

## 2021-11-11 MED ORDER — ASPIRIN EC 81 MG PO TBEC
81.0000 mg | DELAYED_RELEASE_TABLET | Freq: Every day | ORAL | Status: DC
  Administered 2021-11-12 – 2021-11-13 (×2): 81 mg via ORAL
  Filled 2021-11-11 (×2): qty 1

## 2021-11-11 MED ORDER — KETOROLAC TROMETHAMINE 30 MG/ML IJ SOLN
15.0000 mg | Freq: Four times a day (QID) | INTRAMUSCULAR | Status: DC | PRN
  Administered 2021-11-12: 15 mg via INTRAVENOUS
  Filled 2021-11-11: qty 1

## 2021-11-11 MED ORDER — SORBITOL 70 % SOLN
30.0000 mL | Freq: Every day | Status: DC | PRN
  Filled 2021-11-11: qty 30

## 2021-11-11 MED ORDER — ONDANSETRON HCL 4 MG/2ML IJ SOLN
4.0000 mg | Freq: Once | INTRAMUSCULAR | Status: AC
  Administered 2021-11-11: 4 mg via INTRAVENOUS
  Filled 2021-11-11: qty 2

## 2021-11-11 MED ORDER — CLOMIPHENE CITRATE 50 MG PO TABS: 50.0000 mg | ORAL_TABLET | Freq: Every day | ORAL | Status: DC

## 2021-11-11 MED ORDER — ONDANSETRON HCL 4 MG/2ML IJ SOLN: 4.0000 mg | Freq: Once | INTRAMUSCULAR | Status: DC | PRN

## 2021-11-11 MED ORDER — PROPOFOL 10 MG/ML IV BOLUS
INTRAVENOUS | Status: DC | PRN
  Administered 2021-11-11: 100 mg via INTRAVENOUS
  Administered 2021-11-11: 20 mg via INTRAVENOUS

## 2021-11-11 MED ORDER — FLUTICASONE PROPIONATE 50 MCG/ACT NA SUSP
1.0000 | Freq: Every day | NASAL | Status: DC | PRN
  Filled 2021-11-11 (×2): qty 16

## 2021-11-11 MED ORDER — SENNA 8.6 MG PO TABS
1.0000 | ORAL_TABLET | Freq: Two times a day (BID) | ORAL | Status: DC
  Administered 2021-11-12 – 2021-11-13 (×3): 8.6 mg via ORAL
  Filled 2021-11-11 (×3): qty 1

## 2021-11-11 MED ORDER — IOHEXOL 180 MG/ML  SOLN
INTRAMUSCULAR | Status: DC | PRN
  Administered 2021-11-11: 10 mL

## 2021-11-11 MED ORDER — ONDANSETRON HCL 4 MG/2ML IJ SOLN: 4.0000 mg | Freq: Four times a day (QID) | INTRAMUSCULAR | Status: DC | PRN

## 2021-11-11 MED ORDER — ALBUTEROL SULFATE (2.5 MG/3ML) 0.083% IN NEBU: 2.5000 mg | INHALATION_SOLUTION | RESPIRATORY_TRACT | Status: DC | PRN

## 2021-11-11 MED ORDER — ONDANSETRON HCL 4 MG PO TABS
4.0000 mg | ORAL_TABLET | Freq: Four times a day (QID) | ORAL | Status: DC | PRN
  Administered 2021-11-12: 4 mg via ORAL
  Filled 2021-11-11: qty 1

## 2021-11-11 MED ORDER — FENTANYL CITRATE (PF) 100 MCG/2ML IJ SOLN
INTRAMUSCULAR | Status: AC
  Filled 2021-11-11: qty 2

## 2021-11-11 MED ORDER — FERROUS SULFATE 325 (65 FE) MG PO TABS
324.0000 mg | ORAL_TABLET | Freq: Every day | ORAL | Status: DC
  Administered 2021-11-12: 21:00:00 324 mg via ORAL
  Filled 2021-11-11: qty 1

## 2021-11-11 MED ORDER — HYDRALAZINE HCL 20 MG/ML IJ SOLN: 5.0000 mg | Freq: Four times a day (QID) | INTRAMUSCULAR | Status: DC | PRN

## 2021-11-11 MED ORDER — SODIUM CHLORIDE 0.9% FLUSH: 3.0000 mL | Freq: Two times a day (BID) | INTRAVENOUS | Status: DC

## 2021-11-11 MED ORDER — MORPHINE SULFATE (PF) 2 MG/ML IV SOLN
2.0000 mg | INTRAVENOUS | Status: DC | PRN
  Administered 2021-11-11: 2 mg via INTRAVENOUS
  Filled 2021-11-11: qty 1

## 2021-11-11 MED ORDER — COQ10 200 MG PO CAPS: 1.0000 | ORAL_CAPSULE | Freq: Every day | ORAL | Status: DC

## 2021-11-11 MED ORDER — METOCLOPRAMIDE HCL 5 MG/ML IJ SOLN
10.0000 mg | Freq: Once | INTRAMUSCULAR | Status: DC
Start: 2021-11-11 — End: 2021-11-11

## 2021-11-11 MED ORDER — MORPHINE SULFATE (PF) 4 MG/ML IV SOLN: 4.0000 mg | INTRAVENOUS | Status: DC | PRN

## 2021-11-11 MED ORDER — BUSPIRONE HCL 10 MG PO TABS
5.0000 mg | ORAL_TABLET | Freq: Every day | ORAL | Status: DC | PRN
  Administered 2021-11-13: 02:00:00 5 mg via ORAL
  Filled 2021-11-11: qty 1

## 2021-11-11 MED ORDER — LACTATED RINGERS IV SOLN: INTRAVENOUS | Status: DC | PRN

## 2021-11-11 MED ORDER — SODIUM CHLORIDE 0.9 % IV SOLN
1.0000 g | Freq: Once | INTRAVENOUS | Status: AC
  Administered 2021-11-11: 1 g via INTRAVENOUS
  Filled 2021-11-11: qty 10

## 2021-11-11 MED ORDER — POTASSIUM CITRATE ER 10 MEQ (1080 MG) PO TBCR
10.0000 meq | EXTENDED_RELEASE_TABLET | Freq: Every day | ORAL | Status: DC
  Administered 2021-11-12 – 2021-11-13 (×2): 10 meq via ORAL
  Filled 2021-11-11 (×2): qty 1

## 2021-11-11 MED ORDER — SODIUM CHLORIDE 0.9 % IV BOLUS
500.0000 mL | Freq: Once | INTRAVENOUS | Status: AC
  Administered 2021-11-11: 500 mL via INTRAVENOUS

## 2021-11-11 MED ORDER — ASCORBIC ACID 500 MG PO TABS
500.0000 mg | ORAL_TABLET | Freq: Every day | ORAL | Status: DC
  Administered 2021-11-11 – 2021-11-13 (×3): 500 mg via ORAL
  Filled 2021-11-11 (×3): qty 1

## 2021-11-11 MED ORDER — SUCCINYLCHOLINE CHLORIDE 200 MG/10ML IV SOSY
PREFILLED_SYRINGE | INTRAVENOUS | Status: DC | PRN
  Administered 2021-11-11: 120 mg via INTRAVENOUS

## 2021-11-11 MED ORDER — EPHEDRINE SULFATE (PRESSORS) 50 MG/ML IJ SOLN
INTRAMUSCULAR | Status: DC | PRN
  Administered 2021-11-11: 10 mg via INTRAVENOUS

## 2021-11-11 MED ORDER — PANTOPRAZOLE SODIUM 40 MG PO TBEC
40.0000 mg | DELAYED_RELEASE_TABLET | Freq: Every day | ORAL | Status: DC
  Administered 2021-11-12 – 2021-11-13 (×2): 40 mg via ORAL
  Filled 2021-11-11 (×2): qty 1

## 2021-11-11 MED ORDER — ACETAMINOPHEN 10 MG/ML IV SOLN: 1000.0000 mg | Freq: Once | INTRAVENOUS | Status: DC | PRN

## 2021-11-11 MED ORDER — METOPROLOL SUCCINATE ER 50 MG PO TB24: 50.0000 mg | ORAL_TABLET | Freq: Every day | ORAL | Status: DC

## 2021-11-11 MED ORDER — LACTATED RINGERS IV SOLN: INTRAVENOUS | Status: DC

## 2021-11-11 MED ORDER — SODIUM CHLORIDE 0.9 % IV SOLN
2.0000 g | INTRAVENOUS | Status: DC
  Administered 2021-11-12: 18:00:00 2 g via INTRAVENOUS
  Filled 2021-11-11 (×2): qty 20

## 2021-11-11 MED ORDER — OXYCODONE HCL 5 MG/5ML PO SOLN: 5.0000 mg | Freq: Once | ORAL | Status: DC | PRN

## 2021-11-11 MED ORDER — ACETAMINOPHEN 325 MG PO TABS
650.0000 mg | ORAL_TABLET | Freq: Four times a day (QID) | ORAL | Status: DC | PRN
  Administered 2021-11-12: 14:00:00 650 mg via ORAL
  Filled 2021-11-11: qty 2

## 2021-11-11 MED ORDER — OXYCODONE HCL 5 MG PO TABS: 5.0000 mg | ORAL_TABLET | Freq: Once | ORAL | Status: DC | PRN

## 2021-11-11 MED ORDER — IRBESARTAN 75 MG PO TABS: 75.0000 mg | ORAL_TABLET | Freq: Every day | ORAL | Status: DC

## 2021-11-11 MED ORDER — SODIUM CHLORIDE 0.9 % IR SOLN
Status: DC | PRN
  Administered 2021-11-11: 1000 mL

## 2021-11-11 MED ORDER — AMLODIPINE BESYLATE 5 MG PO TABS
2.5000 mg | ORAL_TABLET | ORAL | Status: DC
  Administered 2021-11-11 – 2021-11-13 (×2): 2.5 mg via ORAL
  Filled 2021-11-11 (×2): qty 1

## 2021-11-11 MED ORDER — AMLODIPINE BESYLATE 5 MG PO TABS: 2.5000 mg | ORAL_TABLET | ORAL | Status: DC

## 2021-11-11 MED ORDER — POLYETHYLENE GLYCOL 3350 17 G PO PACK: 17.0000 g | PACK | Freq: Every day | ORAL | Status: DC | PRN

## 2021-11-11 MED ORDER — SODIUM CHLORIDE 0.9 % IV SOLN: INTRAVENOUS | Status: DC

## 2021-11-11 MED ORDER — FENTANYL CITRATE (PF) 100 MCG/2ML IJ SOLN
INTRAMUSCULAR | Status: DC | PRN
  Administered 2021-11-11 (×2): 50 ug via INTRAVENOUS

## 2021-11-11 MED ORDER — PROPOFOL 500 MG/50ML IV EMUL
INTRAVENOUS | Status: AC
  Filled 2021-11-11: qty 50

## 2021-11-11 MED ORDER — ONDANSETRON HCL 4 MG/2ML IJ SOLN
INTRAMUSCULAR | Status: DC | PRN
  Administered 2021-11-11: 4 mg via INTRAVENOUS

## 2021-11-11 SURGICAL SUPPLY — 22 items
BAG DRAIN CYSTO-URO LG1000N (MISCELLANEOUS) ×2 IMPLANT
BRUSH SCRUB EZ 1% IODOPHOR (MISCELLANEOUS) ×2 IMPLANT
CATH URETL OPEN 5X70 (CATHETERS) IMPLANT
CNTNR SPEC 2.5X3XGRAD LEK (MISCELLANEOUS) ×1
CONT SPEC 4OZ STER OR WHT (MISCELLANEOUS) ×1
CONTAINER SPEC 2.5X3XGRAD LEK (MISCELLANEOUS) IMPLANT
GAUZE 4X4 16PLY ~~LOC~~+RFID DBL (SPONGE) ×3 IMPLANT
GLOVE SURG UNDER POLY LF SZ7.5 (GLOVE) ×4 IMPLANT
GOWN STRL REUS W/ TWL XL LVL3 (GOWN DISPOSABLE) ×1 IMPLANT
GOWN STRL REUS W/TWL XL LVL3 (GOWN DISPOSABLE) ×2
GUIDEWIRE STR DUAL SENSOR (WIRE) ×2 IMPLANT
IV NS IRRIG 3000ML ARTHROMATIC (IV SOLUTION) ×2 IMPLANT
KIT TURNOVER CYSTO (KITS) ×2 IMPLANT
MANIFOLD NEPTUNE II (INSTRUMENTS) ×1 IMPLANT
PACK CYSTO AR (MISCELLANEOUS) ×2 IMPLANT
SET CYSTO W/LG BORE CLAMP LF (SET/KITS/TRAYS/PACK) ×2 IMPLANT
SLEEVE SCD COMPRESS KNEE LRG (STOCKING) ×1 IMPLANT
STENT URET 6FRX24 CONTOUR (STENTS) ×1 IMPLANT
STENT URET 6FRX26 CONTOUR (STENTS) IMPLANT
SURGILUBE 2OZ TUBE FLIPTOP (MISCELLANEOUS) ×2 IMPLANT
WATER STERILE IRR 1000ML POUR (IV SOLUTION) ×2 IMPLANT
WATER STERILE IRR 500ML POUR (IV SOLUTION) ×2 IMPLANT

## 2021-11-11 NOTE — Op Note (Signed)
Preoperative diagnosis:  Obstructing left distal ureteral calculus Elevated lactate  Postoperative diagnosis:  Same  Procedure: Cystoscopy with placement left ureteral stent Left retrograde pyelogram  Surgeon: Riki Altes, MD  Anesthesia: General  Complications: None  Intraoperative findings:  Cystoscopy-mild meatal stenosis; dilated to 24 [place].  Urethra otherwise without stricture.  Prostate moderate lateral lobe enlargement and elevated bladder neck with median lobe.  Trabeculated bladder with cellules.  UOs normal-appearing bilaterally.  >10 mm bladder calculus Left retrograde pyelogram: Mild-moderate left hydronephrosis and hydroureter  EBL: Minimal  Specimens: Urine left renal pelvis for culture  Indication: Jeffery Ward is a 88 y.o. male presented to the ED this afternoon with severe renal colic, nausea, vomiting and chills.  CT with a 2 mm left distal ureteral calculus with moderate hydronephrosis/hydroureter.  Although UA with microhematuria and no leukocytosis lactate elevated at 2.7 and ureteral stent recommended.  After reviewing the management options for treatment, he elected to proceed with the above surgical procedure(s). We have discussed the potential benefits and risks of the procedure, side effects of the proposed treatment, the likelihood of the patient achieving the goals of the procedure, and any potential problems that might occur during the procedure or recuperation. Informed consent has been obtained.  Description of procedure:  The patient was taken to the operating room and general anesthesia was induced.  The patient was placed in the dorsal lithotomy position, prepped and draped in the usual sterile fashion, and preoperative antibiotics were administered. A preoperative time-out was performed.   The urethral meatus would not accept a 21 French cystoscope and the distal urethra was dilated with [employer] sounds to 24 [place].  The cystoscope was  then repassed and advanced under direct vision with findings as described above.  A 0.038 Sensor wire was placed through the cystoscope and into the left UO.  The wire was advanced easily to the left renal pelvis under fluoroscopic guidance.  A 5 French open-ended ureteral catheter was then advanced over the wire to the region of the renal pelvis.  The guidewire was removed and urine from the renal pelvis was aspirated which was slightly turbid in appearance.  The urine was sent for culture.  Retrograde pyelogram was then performed through the ureteral catheter with findings as described above.  The guidewire was placed and the ureteral catheter was removed.  A 23F/24 cm Contour ureteral stent was then advanced over the wire without difficulty.  A good curl was noted in the renal pelvis under fluoroscopy and the distal end of stent was well positioned in the bladder under direct vision.  Brisk efflux of blood-tinged urine was noted draining through the stent.  After anesthetic reversal the bladder was emptied and the cystoscope was removed.  After anesthetic reversal he was transported to the PACU in stable condition.  Recommendation: If no temp spike overnight and he feels well on morning can discharge on oral antibiotics Schedule follow-up ureteroscopy with stone removal and cystolitholapaxy 2-3 weeks   Riki Altes, M.D.

## 2021-11-11 NOTE — Assessment & Plan Note (Addendum)
-   See above problem #1 nephrolithiasis. -Urology consulted.

## 2021-11-11 NOTE — Assessment & Plan Note (Signed)
-   Stable. -Urology consulted and following.

## 2021-11-11 NOTE — H&P (Signed)
History and Physical    Jeffery Ward [id number] DOB: 1933-09-05 DOA: 11/11/2021  PCP: [place] [place] Patient coming from: Home  I have personally briefly reviewed patient's old medical records in [place]  Chief Complaint: Left flank pain, nausea and vomiting  HPI: Jeffery Ward is a 88 y.o. male with medical history significant of coronary artery disease status post CABG, low testosterone, GERD, remote history of kidney stones presented to the ED with sudden onset left flank and left back pain started around 1300 hrs. on the day of admission described as an excruciating pain with associated nausea vomiting and diaphoresis, chills.  Patient denies any chest pain, no shortness of breath, no dysuria, no hematuria, no syncopal episodes.  Patient denies any melena, no hematemesis, no hematochezia.  No fever, no significant abdominal pain, no diarrhea, no constipation.  Patient stated had similar pain with his last kidney stone. ED Course: Patient seen in the ED, CT renal stone protocol done concerning for mild left hydroureteronephrosis to the level of a 2 mm stone in the distal left ureter just proximal to the ureteral vesicular junction, additional punctuate nonobstructive bilateral nephrolithiasis, 13 mm bladder calculus.  Lactic acid level noted elevated at 2.7.  Comprehensive metabolic profile with a bicarb of 20, glucose of 162, creatinine of 1.36 otherwise was within normal limits.  CBC done with a hematocrit of 38.9 otherwise was within normal limits.  Patient given IV pain medication, IV Rocephin.  EDP stated consulted with urology who is taking patient to the OR for cystoscopy and possible stent placement.  Review of Systems: As per HPI otherwise all other systems reviewed and are negative.  Past Medical History:  Diagnosis Date   Balanitis    BPH without obstruction/lower urinary tract symptoms    Coronary artery arteriosclerosis    Erectile dysfunction     Hematuria    Hypogonadism in male    Kidney stones    Microscopic hematuria    Obesity    Urinary urgency     Past Surgical History:  Procedure Laterality Date   CORONARY ARTERY BYPASS GRAFT  2011   LITHOTRIPSY      Social History  reports that he has never smoked. He does not have any smokeless tobacco history on file. He reports that he does not drink alcohol and does not use drugs.  Allergies  Allergen Reactions   Amoxicillin Other (See Comments)   Azithromycin Diarrhea   Penicillins Other (See Comments)    Family History  Problem Relation Age of Onset   Pneumonia Mother    Pneumonia Father    Kidney disease Neg Hx    Prostate cancer Neg Hx    Bladder Cancer Neg Hx    Mother deceased age 80 from pneumonia.  Father deceased age 103 from pneumonia.  Prior to Admission medications   Medication Sig Start Date End Date Taking? Authorizing Provider  amLODipine (NORVASC) 5 MG tablet Take 2.5 mg by mouth every other day. 08/28/21  Yes [provider]  aspirin 81 MG EC tablet Take 81 mg by mouth daily. Swallow whole.   Yes [provider]  busPIRone (BUSPAR) 5 MG tablet Take 5 mg by mouth daily as needed. 09/24/21  Yes [provider]  clopidogrel (PLAVIX) 75 MG tablet  06/03/15  Yes [provider]  Coenzyme Q10 (COQ10) 200 MG CAPS Take 1 capsule by mouth daily.   Yes [provider]  ferrous sulfate 324 MG TBEC Take 1 tablet  by mouth at bedtime.   Yes [provider]  pantoprazole (PROTONIX) 40 MG tablet Take by mouth.   Yes [provider]  potassium citrate (UROCIT-K) 10 MEQ (1080 MG) SR tablet Take 10 mEq by mouth daily. 10/13/21  Yes [provider]  rosuvastatin (CRESTOR) 20 MG tablet Take by mouth.   Yes [provider]  vitamin C (ASCORBIC ACID) 250 MG tablet Take 500 mg by mouth daily.   Yes [provider]  clomiPHENE (CLOMID) 50 MG tablet 1/2 tablet daily Patient not taking:  Reported on 11/11/2021 09/11/15   Zara Council A, PA-C  fluticasone Asencion Islam) 50 MCG/ACT nasal spray  06/16/10   [provider]  metoprolol succinate (TOPROL-XL) 25 MG 24 hr tablet Take 25 mg by mouth daily. Patient not taking: Reported on 11/11/2021 09/30/21   [provider]  metoprolol succinate (TOPROL-XL) 50 MG 24 hr tablet Reported on 09/10/2015 Patient not taking: Reported on 11/11/2021 05/03/10   [provider]  metoprolol tartrate (LOPRESSOR) 25 MG tablet  07/01/10   [provider]  neomycin-polymyxin b-dexamethasone (MAXITROL) 3.5-10000-0.1 SUSP Reported on 09/10/2015 Patient not taking: Reported on 11/11/2021 07/26/15   [provider]  oxyCODONE (OXY IR/ROXICODONE) 5 MG immediate release tablet Reported on 09/10/2015 Patient not taking: Reported on 11/11/2021 07/01/10   [provider]  rosuvastatin (CRESTOR) 40 MG tablet Take 40 mg by mouth daily. Patient not taking: Reported on 11/11/2021 09/30/21   [provider]  telmisartan (MICARDIS) 20 MG tablet Take by mouth. Patient not taking: Reported on 11/11/2021    [provider]  terbinafine (LAMISIL) 250 MG tablet  08/06/15   [provider]  Testosterone 75 MG PLLT 75 mg by Implant route every 3 (three) months. Patient not taking: Reported on 11/11/2021    [provider]    Physical Exam: Vitals:   11/11/21 1532 11/11/21 1534 11/11/21 1640 11/11/21 1800  BP:  (!) 170/68 (!) 171/52 (!) 164/70  Pulse:  67 61 64  Resp:  18 (!) 22 18  Temp:  (!) 97.4 F (36.3 C)  98.1 F (36.7 C)  TempSrc:  Oral    SpO2:  95% 98% 100%  Weight: 108.6 kg     Height: 5\' 8"  (1.727 m)       Constitutional: NAD, calm, comfortable Vitals:   11/11/21 1532 11/11/21 1534 11/11/21 1640 11/11/21 1800  BP:  (!) 170/68 (!) 171/52 (!) 164/70  Pulse:  67 61 64  Resp:  18 (!) 22 18  Temp:  (!) 97.4 F (36.3 C)  98.1 F (36.7 C)  TempSrc:  Oral    SpO2:  95% 98%  100%  Weight: 108.6 kg     Height: 5\' 8"  (1.727 m)      Eyes: PERRL, lids and conjunctivae normal ENMT: Mucous membranes are dry. Posterior pharynx clear of any exudate or lesions.Normal dentition.  Neck: normal, supple, no masses, no thyromegaly Respiratory: clear to auscultation bilaterally, no wheezing, no crackles. Normal respiratory effort. No accessory muscle use.  Cardiovascular: Regular rate and rhythm, no murmurs / rubs / gallops. No extremity edema. 2+ pedal pulses. No carotid bruits.  Abdomen: no tenderness, no masses palpated. No hepatosplenomegaly. Bowel sounds positive.  No CVA tenderness bilaterally Musculoskeletal: no clubbing / cyanosis. No joint deformity upper and lower extremities. Good ROM, no contractures. Normal muscle tone.  Skin: no rashes, lesions, ulcers. No induration Neurologic: CN 2-12 grossly intact. Sensation intact, DTR normal. Strength 5/5 in all 4.  Psychiatric: Normal judgment and insight. Alert and oriented x 3. Normal mood.   (Labs on Admission: I have personally reviewed following labs and imaging studies  CBC: Recent Labs  Lab 11/11/21 1539  WBC 5.6  HGB 13.0  HCT 38.9*  MCV 91.5  PLT 99991111    Basic Metabolic Panel: Recent Labs  Lab 11/11/21 1539  NA 137  K 3.6  CL 105  CO2 20*  GLUCOSE 162*  BUN 13  CREATININE 1.36*  CALCIUM 9.3    GFR: Estimated Creatinine Clearance: 46.6 mL/min (A) (by C-G formula based on SCr of 1.36 mg/dL (H)).  Liver Function Tests: Recent Labs  Lab 11/11/21 1539  AST 27  ALT 17  ALKPHOS 45  BILITOT 0.7  PROT 7.4  ALBUMIN 4.2    Urine analysis:    Component Value Date/Time   COLORURINE YELLOW (A) 11/11/2021 1539   APPEARANCEUR CLEAR (A) 11/11/2021 1539   LABSPEC 1.023 11/11/2021 1539   PHURINE 6.0 11/11/2021 1539   GLUCOSEU NEGATIVE 11/11/2021 1539   HGBUR NEGATIVE 11/11/2021 1539   BILIRUBINUR NEGATIVE 11/11/2021 1539   KETONESUR 5 (A) 11/11/2021 1539   PROTEINUR 100 (A) 11/11/2021 1539    NITRITE NEGATIVE 11/11/2021 1539   LEUKOCYTESUR TRACE (A) 11/11/2021 1539    Radiological Exams on Admission: CT Renal Stone Study  Result Date: 11/11/2021 CLINICAL DATA:  Bilateral flank and lower abdominal pain EXAM: CT ABDOMEN AND PELVIS WITHOUT CONTRAST TECHNIQUE: Multidetector CT imaging of the abdomen and pelvis was performed following the standard protocol without IV contrast. RADIATION DOSE REDUCTION: This exam was performed according to the departmental dose-optimization program which includes automated exposure control, adjustment of the mA and/or kV according to patient size and/or use of iterative reconstruction technique. COMPARISON:  Jan 25, 2014 FINDINGS: Lower chest: Evaluation is limited by respiratory motion. Hepatobiliary: No suspicious hepatic lesion on this noncontrast examination. Gallbladder is unremarkable. No biliary ductal dilation. Pancreas: No pancreatic ductal dilation or evidence of acute inflammation. Spleen: Normal in size without focal abnormality. Adrenals/Urinary Tract: Bilateral adrenal glands appear normal. Mild left hydroureteronephrosis to the level of a 2 mm stone in the distal ureter just proximal to the ureterovesicular junction. Additional punctate nonobstructive bilateral nephrolithiasis. 13 mm bladder calculus. Mild symmetric wall thickening of a nondistended urinary bladder. Stomach/Bowel: No enteric contrast was administered. Small to moderate hiatal hernia. Stomach is unremarkable for degree of distension. No pathologic dilation of small or large bowel. The appendix and terminal ileum appear normal. Small volume of formed stool throughout the colon. No evidence of acute bowel inflammation. Vascular/Lymphatic: Aortic and branch vessel atherosclerosis without abdominal aortic aneurysm. No pathologically enlarged abdominal or pelvic lymph nodes. Reproductive: Prostate is unremarkable. Other: No significant abdominopelvic free fluid. Musculoskeletal: Diffuse  demineralization of bone. Multilevel degenerative changes spine with grade 1 degenerative L4 on L5 anterolisthesis. Mild degenerative change of the hips and SI joints. IMPRESSION: 1. Mild left hydroureteronephrosis to the level of a 2 mm stone in the distal left ureter just proximal to the ureterovesicular junction. 2. Additional punctate nonobstructive bilateral nephrolithiasis. 3. 13 mm bladder calculus. 4. Small to moderate hiatal hernia. 5.  Aortic Atherosclerosis (ICD10-I70.0). Electronically Signed   By: Dahlia Bailiff M.D.   On: 11/11/2021 16:37    EKG: Independently reviewed.  Normal sinus rhythm, occasional PVCs, no ischemic changes noted.  Assessment and Plan: * Nephrolithiasis - Patient presenting with sudden onset left flank pain, nausea and emesis with prior history of kidney stones. -CT renal stone protocol done with  mild left hydroureteronephrosis to the level of a 2 mm stone in the distal left ureter just proximal to the ureteral vesicular junction, additional punctuate nonobstructing bilateral nephrolithiasis, 13 mm bladder calculus. -Renal function stable with creatinine at 1.36. -Urinalysis done with trace leukocytes, nitrite negative, 0-5 WBCs. -Patient afebrile.-Normal white count. -Lactic acid level elevated at 2.7. -Patient received a dose of IV Rocephin in the ED. -Check urine cultures. -EDP stated he consulted urology, Dr. Bernardo Heater who is recommending cystoscopy and possible stent management in the OR which patient is going to be taking to soon. -Place on IV Rocephin. -Urology consulted.   Hydroureteronephrosis- (present on admission) - See above problem #1 nephrolithiasis. -Urology consulted.  Dehydration- (present on admission) - Normal saline 500 cc bolus x1, then normal saline at 100 cc an hour. -Follow-up.  Hx of CABG - Patient with coronary artery disease status post history of CABG approximately 7 years ago per patient. -Currently asymptomatic. -Resume  home cardiac regimen of Toprol-XL, Avapro, aspirin.  CAD (coronary artery disease)- (present on admission) - Stable.  Asymptomatic. -Resume home cardiac regimen of aspirin, Avapro, Toprol-XL.  GERD (gastroesophageal reflux disease)- (present on admission) - PPI.  Lactic acidosis- (present on admission) - Likely secondary to volume depletion/dehydration in the setting of kidney stone. -Patient afebrile, normal white count. -Check urine cultures. -Repeat lactic acid level in the a.m. after the patient has been hydrated. -Place empirically on IV Rocephin due to problem #1 and probable cystoscopy and stent placement. -IV fluids, supportive care.  BPH with obstruction/lower urinary tract symptoms- (present on admission) - Stable. -Urology consulted and following.         DVT prophylaxis: SCDs Code Status:   Full Family Communication:  Updated granddaughter at bedside, wife on the telephone Disposition Plan:   Patient is from:  Home  Anticipated DC to:  Likely home with home health  Anticipated DC date:  TBD  Anticipated DC barriers: Clinical improved  Consults called:  Urology: EDP stated consulted with Dr. Elenor Quinones Admission status:  Placed in observation.  Severity of Illness: The appropriate patient status for this patient is OBSERVATION. Observation status is judged to be reasonable and necessary in order to provide the required intensity of service to ensure the patient's safety. The patient's presenting symptoms, physical exam findings, and initial radiographic and laboratory data in the context of their medical condition is felt to place them at decreased risk for further clinical deterioration. Furthermore, it is anticipated that the patient will be medically stable for discharge from the hospital within 2 midnights of admission.     Irine Seal MD [facility]  How to contact the [facility] Attending or Consulting provider [place] or covering provider during after  hours [place], for this patient?   Check the care team in [facility] and look for a) attending/consulting [facility] provider listed and b) the [facility] team listed Log into [email or web address] and use [place]'s universal password to access. If you do not have the password, please contact the hospital operator. Locate the [facility] provider you are looking for under [facility] and page to a number that you can be directly reached. If you still have difficulty reaching the provider, please page the [facility] (Director on Call) for the Hospitalists listed on amion for assistance.  11/11/2021, 6:40 PM

## 2021-11-11 NOTE — Assessment & Plan Note (Signed)
-   Stable.  Asymptomatic. -Resume home cardiac regimen of aspirin, Avapro, Toprol-XL.

## 2021-11-11 NOTE — Progress Notes (Signed)
PHARMACIST - PHYSICIAN ORDER COMMUNICATION  CONCERNING: P&T Medication Policy on Herbal Medications  DESCRIPTION:  This patients order for:  CoQ10 CAPS  has been noted.  This product(s) is classified as an herbal or natural product. Due to a lack of definitive safety studies or FDA approval, nonstandard manufacturing practices, plus the potential risk of unknown drug-drug interactions while on inpatient medications, the Pharmacy and Therapeutics Committee does not permit the use of herbal or natural products of this type within [facility].   ACTION TAKEN: The pharmacy department is unable to verify this order at this time.  Please reevaluate patients clinical condition at discharge and address if the herbal or natural product(s) should be resumed at that time.  Gardner Candle, PharmD, BCPS Clinical Pharmacist 11/11/2021 7:04 PM

## 2021-11-11 NOTE — Assessment & Plan Note (Signed)
-   Likely secondary to volume depletion/dehydration in the setting of kidney stone. -Patient afebrile, normal white count. -Check urine cultures. -Repeat lactic acid level in the a.m. after the patient has been hydrated. -Place empirically on IV Rocephin due to problem #1 and probable cystoscopy and stent placement. -IV fluids, supportive care.

## 2021-11-11 NOTE — ED Provider Notes (Signed)
[facility] Provider Note    Event Date/Time   First MD Initiated Contact with Patient 11/11/21 1530     (approximate)   History   Abdominal Pain (/)   HPI  Jeffery Ward is a 88 y.o. male presents the ER with a history of remote kidney stones presenting to the ER for sudden onset left back and flank pain started around 1:00 today.  Describes as excruciating pain associate with nausea and diaphoresis.  Denies any chest pain or shortness of breath.  States similar pain when he last had kidney stone.  Denies any dysuria.     Physical Exam   Triage Vital Signs: ED Triage Vitals  Enc Vitals Group     BP 11/11/21 1534 (!) 170/68     Pulse Rate 11/11/21 1534 67     Resp 11/11/21 1534 18     Temp 11/11/21 1534 (!) 97.4 F (36.3 C)     Temp Source 11/11/21 1534 Oral     SpO2 11/11/21 1534 95 %     Weight 11/11/21 1532 239 lb 6.7 oz (108.6 kg)     Height 11/11/21 1532 5\' 8"  (1.727 m)     Head Circumference --      Peak Flow --      Pain Score 11/11/21 1532 9     Pain Loc --      Pain Edu? --      Excl. in [place]? --     Most recent vital signs: Vitals:   11/11/21 2130 11/11/21 2213  BP: 126/73 (!) 142/60  Pulse: 86 83  Resp: 19 18  Temp:  97.7 F (36.5 C)  SpO2: 95% 97%     Constitutional: Alert  Eyes: Conjunctivae are normal.  Head: Atraumatic. Nose: No congestion/rhinnorhea. Mouth/Throat: Mucous membranes are moist.   Neck: Painless ROM.  Cardiovascular:   Good peripheral circulation. Respiratory: Normal respiratory effort.  No retractions.  Gastrointestinal: Soft and nontender.  Left cva ttp, no mass or hernia appreciated Musculoskeletal:  no deformity Neurologic:  MAE spontaneously. No gross focal neurologic deficits are appreciated.  Skin:  Skin is warm, dry and intact. No rash noted. Psychiatric: Mood and affect are normal. Speech and behavior are normal.    ED Results / Procedures / Treatments   Labs (all labs ordered are  listed, but only abnormal results are displayed) Labs Reviewed  CBC - Abnormal; Notable for the following components:      Result Value   HCT 38.9 (*)    All other components within normal limits  COMPREHENSIVE METABOLIC PANEL - Abnormal; Notable for the following components:   CO2 20 (*)    Glucose, Bld 162 (*)    Creatinine, Ser 1.36 (*)    GFR, Estimated 51 (*)    All other components within normal limits  LACTIC ACID, PLASMA - Abnormal; Notable for the following components:   Lactic Acid, Venous 2.7 (*)    All other components within normal limits  URINALYSIS, COMPLETE (UACMP) WITH MICROSCOPIC - Abnormal; Notable for the following components:   Color, Urine YELLOW (*)    APPearance CLEAR (*)    Ketones, ur 5 (*)    Protein, ur 100 (*)    Leukocytes,Ua TRACE (*)    All other components within normal limits  RESP PANEL BY RT-PCR (FLU A&B, COVID) [id number]  URINE CULTURE  URINE CULTURE  LIPASE, BLOOD  MAGNESIUM  PHOSPHORUS  COMPREHENSIVE METABOLIC PANEL  CBC WITH DIFFERENTIAL/PLATELET  LACTIC  ACID, PLASMA  TROPONIN I (HIGH SENSITIVITY)     EKG  ED ECG REPORT I, Merlyn Lot, the attending physician, personally viewed and interpreted this ECG.   Date: 11/11/2021  EKG Time: 15:57  Rate: 65  Rhythm: sinus with pvc  Axis: normal  Intervals: borderline prolonged qt  ST&T Change: no stemi, no depression    RADIOLOGY Please see ED Course for my review and interpretation.  I personally reviewed all radiographic images ordered to evaluate for the above acute complaints and reviewed radiology reports and findings.  These findings were personally discussed with the patient.  Please see medical record for radiology report.    PROCEDURES:  Critical Care performed:   Procedures   MEDICATIONS ORDERED IN ED: Medications  morphine (PF) 2 MG/ML injection 2 mg ( Intravenous MAR Unhold 11/11/21 2200)  morphine (PF) 4 MG/ML injection 4 mg ( Intravenous MAR Unhold  11/11/21 2200)  0.9 %  sodium chloride infusion ( Intravenous New Bag/Given 11/11/21 2211)  sodium chloride 0.9 % bolus 500 mL ( Intravenous MAR Unhold 11/11/21 2200)  aspirin EC tablet 81 mg ( Oral MAR Unhold 11/11/21 2200)  fluticasone (FLONASE) 50 MCG/ACT nasal spray 1 spray ( Each Nare MAR Unhold 11/11/21 2200)  oxyCODONE (Oxy IR/ROXICODONE) immediate release tablet 5 mg ( Oral MAR Unhold 11/11/21 2200)  pantoprazole (PROTONIX) EC tablet 40 mg ( Oral MAR Unhold 11/11/21 2200)  sodium chloride flush (NS) 0.9 % injection 3 mL ( Intravenous MAR Unhold 11/11/21 2200)  acetaminophen (TYLENOL) tablet 650 mg ( Oral MAR Unhold 11/11/21 2200)    Or  acetaminophen (TYLENOL) suppository 650 mg ( Rectal MAR Unhold 11/11/21 2200)  ketorolac (TORADOL) 30 MG/ML injection 15 mg ( Intravenous MAR Unhold 11/11/21 2200)  senna (SENOKOT) tablet 8.6 mg ( Oral MAR Unhold 11/11/21 2200)  polyethylene glycol (MIRALAX / GLYCOLAX) packet 17 g ( Oral MAR Unhold 11/11/21 2200)  sorbitol 70 % solution 30 mL ( Oral MAR Unhold 11/11/21 2200)  ondansetron (ZOFRAN) tablet 4 mg ( Oral MAR Unhold 11/11/21 2200)    Or  ondansetron (ZOFRAN) injection 4 mg ( Intravenous MAR Unhold 11/11/21 2200)  albuterol (PROVENTIL) (2.5 MG/3ML) 0.083% nebulizer solution 2.5 mg ( Nebulization MAR Unhold 11/11/21 2200)  hydrALAZINE (APRESOLINE) injection 5 mg ( Intravenous MAR Unhold 11/11/21 2200)  cefTRIAXone (ROCEPHIN) 2 g in sodium chloride 0.9 % 100 mL IVPB ( Intravenous MAR Unhold 11/11/21 2200)  busPIRone (BUSPAR) tablet 5 mg ( Oral MAR Unhold 11/11/21 2200)  ferrous sulfate tablet 324 mg ( Oral MAR Unhold 11/11/21 2200)  potassium citrate (UROCIT-K) SR tablet 10 mEq ( Oral MAR Unhold 11/11/21 2200)  ascorbic acid (VITAMIN C) tablet 500 mg (500 mg Oral Given 11/11/21 2212)  amLODipine (NORVASC) tablet 2.5 mg (2.5 mg Oral Given 11/11/21 2214)  sodium chloride 0.9 % bolus 500 mL (500 mLs Intravenous New Bag/Given 11/11/21 1603)  ondansetron (ZOFRAN)  injection 4 mg (4 mg Intravenous Given 11/11/21 1636)  cefTRIAXone (ROCEPHIN) 1 g in sodium chloride 0.9 % 100 mL IVPB (1 g Intravenous New Bag/Given 11/11/21 1716)  sodium chloride 0.9 % bolus 500 mL (0 mLs Intravenous Stopped 11/11/21 1722)     IMPRESSION / MDM / ASSESSMENT AND PLAN / ED COURSE  I reviewed the triage vital signs and the nursing notes.                              Differential diagnosis includes, but is not limited  to, stone, [place], SBO, AAA, dissection, colitis, diverticulitis, UTI, sepsis  Patient presenting with symptoms as described above.  Clinically appears unwell very uncomfortable will check blood work about differential we will check urine.  Will order CT imaging.  Will provide IV morphine for pain.  Will give IV fluids.   Clinical Course as of 11/11/21 2251  Tue Nov 11, 2021  1632 CT imaging by my review appears consistent with acute left distal ureteral stone.  Urinalysis does not show any sign of infection. [PR]    Clinical Course User Index [PR] Merlyn Lot, MD  Patient's lactate was elevated to 2.7 and given his ill appearance on arrival I did consult with Dr. Bernardo Heater of urology regarding need for possible stent.  On his review does recommend proceeding to the OR for stent placement.  I did give a dose of IV Rocephin we will continue with IV hydration.  He is not meeting any other septic criteria.  Hospitalist was consulted for admission and they agreed accept patient to their service.   FINAL CLINICAL IMPRESSION(S) / ED DIAGNOSES   Final diagnoses:  Left ureteral stone  Flank pain     Rx / DC Orders   ED Discharge Orders     None        Note:  This document was prepared using Dragon voice recognition software and may include unintentional dictation errors.    Merlyn Lot, MD 11/11/21 2251

## 2021-11-11 NOTE — ED Notes (Addendum)
Pt to ED for vomiting and L flank pain since about 1300 today. Denies urinary symptoms or blood in stools.  Hx renal stones.  Pt appears pale and slightly diaphoretic, grimacing in pain. Mucous membranes dry and furrowed.

## 2021-11-11 NOTE — Assessment & Plan Note (Signed)
-   Patient with coronary artery disease status post history of CABG approximately 7 years ago per patient. -Currently asymptomatic. -Resume home cardiac regimen of Toprol-XL, Avapro, aspirin.

## 2021-11-11 NOTE — Anesthesia Procedure Notes (Signed)
Procedure Name: Intubation Date/Time: 11/11/2021 8:14 PM Performed by: Lendon Colonel, CRNA Pre-anesthesia Checklist: Patient identified, Emergency Drugs available, Suction available and Patient being monitored Patient Re-evaluated:Patient Re-evaluated prior to induction Oxygen Delivery Method: Circle system utilized Preoxygenation: Pre-oxygenation with 100% oxygen Induction Type: IV induction and Rapid sequence Laryngoscope Size: 3 and McGraph Grade View: Grade I Tube type: Oral Tube size: 7.0 mm Number of attempts: 1 Airway Equipment and Method: Stylet Placement Confirmation: ETT inserted through vocal cords under direct vision, positive ETCO2 and breath sounds checked- equal and bilateral Secured at: 21 cm Tube secured with: Tape Dental Injury: Teeth and Oropharynx as per pre-operative assessment

## 2021-11-11 NOTE — Assessment & Plan Note (Signed)
PPI ?

## 2021-11-11 NOTE — Consult Note (Signed)
Urology Consult  Requesting physician: Merlyn Lot, MD  Reason for consultation: Obstructing left distal ureteral calculus with renal colic and possible sepsis  Chief Complaint: Kidney stone  History of Present Illness: Jeffery Ward is a 88 y.o. male who presented to the ED this afternoon with acute onset of left flank pain approximately 2 hours prior to presentation.  Pain described as severe and associated with nausea/vomiting/diaphoresis and chills.  He was afebrile and urinalysis showed microhematuria however lactate elevated at 2.7.  Stone protocol CT with a 2-3 mm distal ureteral calculus with mild hydronephrosis/hydroureter.  There was a nonobstructing left renal calculus and a 13 mm bladder calculus.  Prior history of stone disease in the remote past with previous lithotripsy  Past Medical History:  Diagnosis Date   Balanitis    BPH without obstruction/lower urinary tract symptoms    Coronary artery arteriosclerosis    Erectile dysfunction    Hematuria    Hypogonadism in male    Kidney stones    Microscopic hematuria    Obesity    Urinary urgency     Past Surgical History:  Procedure Laterality Date   CORONARY ARTERY BYPASS GRAFT  2011   LITHOTRIPSY      Home Medications:  Current Meds  Medication Sig   amLODipine (NORVASC) 5 MG tablet Take 2.5 mg by mouth every other day.   aspirin 81 MG EC tablet Take 81 mg by mouth daily. Swallow whole.   busPIRone (BUSPAR) 5 MG tablet Take 5 mg by mouth daily as needed.   clopidogrel (PLAVIX) 75 MG tablet    Coenzyme Q10 (COQ10) 200 MG CAPS Take 1 capsule by mouth daily.   ferrous sulfate 324 MG TBEC Take 1 tablet by mouth at bedtime.   pantoprazole (PROTONIX) 40 MG tablet Take by mouth.   potassium citrate (UROCIT-K) 10 MEQ (1080 MG) SR tablet Take 10 mEq by mouth daily.   rosuvastatin (CRESTOR) 20 MG tablet Take by mouth.   vitamin C (ASCORBIC ACID) 250 MG tablet Take 500 mg by mouth daily.    Allergies:   Allergies  Allergen Reactions   Amoxicillin Other (See Comments)   Azithromycin Diarrhea   Penicillins Other (See Comments)    Family History  Problem Relation Age of Onset   Pneumonia Mother    Pneumonia Father    Kidney disease Neg Hx    Prostate cancer Neg Hx    Bladder Cancer Neg Hx     Social History:  reports that he has never smoked. He does not have any smokeless tobacco history on file. He reports that he does not drink alcohol and does not use drugs.  ROS: A complete review of systems was performed.  All systems are negative except for pertinent findings as noted.  Physical Exam:  Vital signs in last 24 hours: Temp:  [97.4 F (36.3 C)-98.1 F (36.7 C)] 98.1 F (36.7 C) (02/21 1800) Pulse Rate:  [61-67] 64 (02/21 1800) Resp:  [18-22] 18 (02/21 1800) BP: (164-171)/(52-70) 164/70 (02/21 1800) SpO2:  [95 %-100 %] 100 % (02/21 1800) Weight:  [108.6 kg] 108.6 kg (02/21 1532) Constitutional:  Alert and oriented, No acute distress HEENT: [place] AT, moist mucus membranes.  Trachea midline, no masses Cardiovascular: Regular rate and rhythm, no clubbing, cyanosis, or edema. Respiratory: Normal respiratory effort, lungs clear bilaterally GI: Abdomen is soft, nontender, nondistended, no abdominal masses GU: No CVA tenderness Skin: No rashes, bruises or suspicious lesions Lymph: No cervical or inguinal adenopathy Neurologic:  Grossly intact, no focal deficits, moving all 4 extremities Psychiatric: Normal mood and affect   Laboratory Data:  Recent Labs    11/11/21 1539  WBC 5.6  HGB 13.0  HCT 38.9*   Recent Labs    11/11/21 1539  NA 137  K 3.6  CL 105  CO2 20*  GLUCOSE 162*  BUN 13  CREATININE 1.36*  CALCIUM 9.3   No results for input(s): LABPT, INR in the last 72 hours. No results for input(s): LABURIN in the last 72 hours. Results for orders placed or performed during the hospital encounter of 11/11/21  Resp Panel by RT-PCR (Flu A&B, Covid) Nasopharyngeal  Swab     Status: None   Collection Time: 11/11/21  5:18 PM   Specimen: Nasopharyngeal Swab; Nasopharyngeal(NP) swabs in vial transport medium  Result Value Ref Range Status   SARS Coronavirus 2 by RT PCR NEGATIVE NEGATIVE Final    Comment: (NOTE) SARS-CoV-2 target nucleic acids are NOT DETECTED.  The SARS-CoV-2 RNA is generally detectable in upper respiratory specimens during the acute phase of infection. The lowest concentration of SARS-CoV-2 viral copies this assay can detect is 138 copies/mL. A negative result does not preclude SARS-Cov-2 infection and should not be used as the sole basis for treatment or other patient management decisions. A negative result may occur with  improper specimen collection/handling, submission of specimen other than nasopharyngeal swab, presence of viral mutation(s) within the areas targeted by this assay, and inadequate number of viral copies(<138 copies/mL). A negative result must be combined with clinical observations, patient history, and epidemiological information. The expected result is Negative.  Fact Sheet for Patients:  EntrepreneurPulse.com.au  Fact Sheet for Healthcare Providers:  IncredibleEmployment.be  This test is no t yet approved or cleared by the [place] FDA and  has been authorized for detection and/or diagnosis of SARS-CoV-2 by FDA under an Emergency Use Authorization (EUA). This EUA will remain  in effect (meaning this test can be used) for the duration of the COVID-19 declaration under Section 564(b)(1) of the Act, 21 U.S.C.section 360bbb-3(b)(1), unless the authorization is terminated  or revoked sooner.       Influenza A by PCR NEGATIVE NEGATIVE Final   Influenza B by PCR NEGATIVE NEGATIVE Final    Comment: (NOTE) The Xpert Xpress SARS-CoV-2/FLU/RSV plus assay is intended as an aid in the diagnosis of influenza from Nasopharyngeal swab specimens and should not be used as a sole  basis for treatment. Nasal washings and aspirates are unacceptable for Xpert Xpress SARS-CoV-2/FLU/RSV testing.  Fact Sheet for Patients: EntrepreneurPulse.com.au  Fact Sheet for Healthcare Providers: IncredibleEmployment.be  This test is not yet approved or cleared by the [place] FDA and has been authorized for detection and/or diagnosis of SARS-CoV-2 by FDA under an Emergency Use Authorization (EUA). This EUA will remain in effect (meaning this test can be used) for the duration of the COVID-19 declaration under Section 564(b)(1) of the Act, 21 U.S.C. section 360bbb-3(b)(1), unless the authorization is terminated or revoked.  Performed at [facility], [address], [place], [place] [address]      Radiologic Imaging: CT images were personally reviewed and interpreted  DG OR UROLOGY CYSTO IMAGE ([place])  Result Date: 11/11/2021 There is no interpretation for this exam.  This order is for images obtained during a surgical procedure.  Please See "Surgeries" Tab for more information regarding the procedure.   CT Renal Stone Study  Result Date: 11/11/2021 CLINICAL DATA:  Bilateral flank and lower  abdominal pain EXAM: CT ABDOMEN AND PELVIS WITHOUT CONTRAST TECHNIQUE: Multidetector CT imaging of the abdomen and pelvis was performed following the standard protocol without IV contrast. RADIATION DOSE REDUCTION: This exam was performed according to the departmental dose-optimization program which includes automated exposure control, adjustment of the mA and/or kV according to patient size and/or use of iterative reconstruction technique. COMPARISON:  Jan 25, 2014 FINDINGS: Lower chest: Evaluation is limited by respiratory motion. Hepatobiliary: No suspicious hepatic lesion on this noncontrast examination. Gallbladder is unremarkable. No biliary ductal dilation. Pancreas: No pancreatic ductal dilation or evidence of acute inflammation.  Spleen: Normal in size without focal abnormality. Adrenals/Urinary Tract: Bilateral adrenal glands appear normal. Mild left hydroureteronephrosis to the level of a 2 mm stone in the distal ureter just proximal to the ureterovesicular junction. Additional punctate nonobstructive bilateral nephrolithiasis. 13 mm bladder calculus. Mild symmetric wall thickening of a nondistended urinary bladder. Stomach/Bowel: No enteric contrast was administered. Small to moderate hiatal hernia. Stomach is unremarkable for degree of distension. No pathologic dilation of small or large bowel. The appendix and terminal ileum appear normal. Small volume of formed stool throughout the colon. No evidence of acute bowel inflammation. Vascular/Lymphatic: Aortic and branch vessel atherosclerosis without abdominal aortic aneurysm. No pathologically enlarged abdominal or pelvic lymph nodes. Reproductive: Prostate is unremarkable. Other: No significant abdominopelvic free fluid. Musculoskeletal: Diffuse demineralization of bone. Multilevel degenerative changes spine with grade 1 degenerative L4 on L5 anterolisthesis. Mild degenerative change of the hips and SI joints. IMPRESSION: 1. Mild left hydroureteronephrosis to the level of a 2 mm stone in the distal left ureter just proximal to the ureterovesicular junction. 2. Additional punctate nonobstructive bilateral nephrolithiasis. 3. 13 mm bladder calculus. 4. Small to moderate hiatal hernia. 5.  Aortic Atherosclerosis (ICD10-I70.0). Electronically Signed   By: Dahlia Bailiff M.D.   On: 11/11/2021 16:37    Impression/Recommendation:  88 y.o. male with a left distal ureteral calculus and renal colic.  Due to elevated lactate I have recommended cystoscopy with placement left ureteral stent.  Urine will be obtained proximal to the calculus and sent for culture Due to the possibility of infection we discussed no attempt will be made to remove the stone and he will require definitive stone  treatment in the next few weeks if it does not pass. He will also need treatment of his 13 mm bladder calculus and also has a nonobstructing left renal calculus. The procedure was discussed in detail including potential risks of bleeding, infection/sepsis and ureteral injury.  All questions were answered and he desires to proceed    11/11/2021, 7:23 PM  John Giovanni,  MD

## 2021-11-11 NOTE — ED Notes (Signed)
2.7 lactic acid called by lab. EDP informed.

## 2021-11-11 NOTE — Transfer of Care (Signed)
Immediate Anesthesia Transfer of Care Note  Patient: Jeffery Ward  Procedure(s) Performed: CYSTOSCOPY WITH STENT PLACEMENT (Left)  Patient Location: PACU  Anesthesia Type:General  Level of Consciousness: sedated and patient cooperative  Airway & Oxygen Therapy: Patient Spontanous Breathing and Patient connected to face mask oxygen  Post-op Assessment: Report given to RN and Post -op Vital signs reviewed and stable  Post vital signs: Reviewed and stable  Last Vitals:  Vitals Value Taken Time  BP 131/77 11/11/21 2051  Temp 36.4 C 11/11/21 2051  Pulse 88 11/11/21 2054  Resp 20 11/11/21 2054  SpO2 100 % 11/11/21 2054  Vitals shown include unvalidated device data.  Last Pain:  Vitals:   11/11/21 1637  TempSrc:   PainSc: 10-Worst pain ever         Complications: No notable events documented.

## 2021-11-11 NOTE — ED Triage Notes (Signed)
Presents via EMS from home  family states he developed some lower abd pain which radiates into bilateral flank pain

## 2021-11-11 NOTE — Anesthesia Preprocedure Evaluation (Addendum)
Anesthesia Evaluation  Patient identified by MRN, date of birth, ID band Patient awake    Reviewed: Allergy & Precautions, NPO status , Patient's Chart, lab work & pertinent test results  History of Anesthesia Complications Negative for: history of anesthetic complications  Airway Mallampati: III   Neck ROM: Full    Dental  (+) Poor Dentition Many molars missing, upper front tooth chipped:   Pulmonary  COVID 1 month ago, asymptomatic s/p Paxlovid   Pulmonary exam normal breath sounds clear to auscultation       Cardiovascular hypertension, + CAD (s/p 1V CABG 2011 on Plavix)  Normal cardiovascular exam Rhythm:Regular Rate:Normal  ECG 11/11/21:  Sinus rhythm with occasional Premature ventricular complexes Prolonged QT   Neuro/Psych HOH    GI/Hepatic GERD  Medicated,  Endo/Other  Obesity   Renal/GU Renal disease (nephrolithiasis)   BPH    Musculoskeletal  (+) Arthritis ,   Abdominal   Peds  Hematology negative hematology ROS (+)   Anesthesia Other Findings Unable to find recent cardiology records, but pt states he sees Dr. Park Breed quarterly and everything is stable.  Reproductive/Obstetrics                            Anesthesia Physical Anesthesia Plan  ASA: 2  Anesthesia Plan: General   Post-op Pain Management:    Induction: Intravenous  PONV Risk Score and Plan: 2 and Ondansetron, Dexamethasone and Treatment may vary due to age or medical condition  Airway Management Planned: Oral ETT  Additional Equipment:   Intra-op Plan:   Post-operative Plan:   Informed Consent: I have reviewed the patients History and Physical, chart, labs and discussed the procedure including the risks, benefits and alternatives for the proposed anesthesia with the patient or authorized representative who has indicated his/her understanding and acceptance.     Dental advisory given  Plan Discussed  with: CRNA  Anesthesia Plan Comments: (Patient consented for risks of anesthesia including but not limited to:  - adverse reactions to medications - damage to eyes, teeth, lips or other oral mucosa - nerve damage due to positioning  - sore throat or hoarseness - damage to heart, brain, nerves, lungs, other parts of body or loss of life  Informed patient about role of CRNA in peri- and intra-operative care.  Patient voiced understanding.)      Anesthesia Quick Evaluation

## 2021-11-11 NOTE — Assessment & Plan Note (Addendum)
-   Patient presenting with sudden onset left flank pain, nausea and emesis with prior history of kidney stones. -CT renal stone protocol done with mild left hydroureteronephrosis to the level of a 2 mm stone in the distal left ureter just proximal to the ureteral vesicular junction, additional punctuate nonobstructing bilateral nephrolithiasis, 13 mm bladder calculus. -Renal function stable with creatinine at 1.36. -Urinalysis done with trace leukocytes, nitrite negative, 0-5 WBCs. -Patient afebrile.-Normal white count. -Lactic acid level elevated at 2.7. -Patient received a dose of IV Rocephin in the ED. -Check urine cultures. -EDP stated he consulted urology, Dr. Lonna Cobb who is recommending cystoscopy and possible stent management in the OR which patient is going to be taking to soon. -Place on IV Rocephin. -Urology consulted.

## 2021-11-11 NOTE — Assessment & Plan Note (Signed)
-   Normal saline 500 cc bolus x1, then normal saline at 100 cc an hour. -Follow-up.

## 2021-11-12 ENCOUNTER — Other Ambulatory Visit: Payer: Self-pay | Admitting: Urology

## 2021-11-12 ENCOUNTER — Encounter: Payer: Self-pay | Admitting: Urology

## 2021-11-12 ENCOUNTER — Telehealth: Payer: Self-pay | Admitting: Urology

## 2021-11-12 DIAGNOSIS — N132 Hydronephrosis with renal and ureteral calculous obstruction: Secondary | ICD-10-CM | POA: Diagnosis not present

## 2021-11-12 DIAGNOSIS — N401 Enlarged prostate with lower urinary tract symptoms: Secondary | ICD-10-CM | POA: Diagnosis not present

## 2021-11-12 DIAGNOSIS — Z960 Presence of urogenital implants: Secondary | ICD-10-CM

## 2021-11-12 DIAGNOSIS — N210 Calculus in bladder: Secondary | ICD-10-CM

## 2021-11-12 DIAGNOSIS — N134 Hydroureter: Secondary | ICD-10-CM | POA: Diagnosis not present

## 2021-11-12 DIAGNOSIS — N200 Calculus of kidney: Secondary | ICD-10-CM | POA: Diagnosis not present

## 2021-11-12 DIAGNOSIS — N1330 Unspecified hydronephrosis: Secondary | ICD-10-CM | POA: Diagnosis not present

## 2021-11-12 DIAGNOSIS — N138 Other obstructive and reflux uropathy: Secondary | ICD-10-CM | POA: Diagnosis not present

## 2021-11-12 DIAGNOSIS — N201 Calculus of ureter: Secondary | ICD-10-CM

## 2021-11-12 LAB — CBC WITH DIFFERENTIAL/PLATELET
Abs Immature Granulocytes: 0.02 10*3/uL (ref 0.00–0.07)
Basophils Absolute: 0 10*3/uL (ref 0.0–0.1)
Basophils Relative: 0 %
Eosinophils Absolute: 0 10*3/uL (ref 0.0–0.5)
Eosinophils Relative: 0 %
HCT: 36.5 % — ABNORMAL LOW (ref 39.0–52.0)
Hemoglobin: 12.1 g/dL — ABNORMAL LOW (ref 13.0–17.0)
Immature Granulocytes: 0 %
Lymphocytes Relative: 17 %
Lymphs Abs: 1.1 10*3/uL (ref 0.7–4.0)
MCH: 30.9 pg (ref 26.0–34.0)
MCHC: 33.2 g/dL (ref 30.0–36.0)
MCV: 93.1 fL (ref 80.0–100.0)
Monocytes Absolute: 0.5 10*3/uL (ref 0.1–1.0)
Monocytes Relative: 8 %
Neutro Abs: 4.8 10*3/uL (ref 1.7–7.7)
Neutrophils Relative %: 75 %
Platelets: 181 10*3/uL (ref 150–400)
RBC: 3.92 MIL/uL — ABNORMAL LOW (ref 4.22–5.81)
RDW: 14.1 % (ref 11.5–15.5)
WBC: 6.4 10*3/uL (ref 4.0–10.5)
nRBC: 0 % (ref 0.0–0.2)

## 2021-11-12 LAB — COMPREHENSIVE METABOLIC PANEL
ALT: 14 U/L (ref 0–44)
AST: 23 U/L (ref 15–41)
Albumin: 3.6 g/dL (ref 3.5–5.0)
Alkaline Phosphatase: 40 U/L (ref 38–126)
Anion gap: 9 (ref 5–15)
BUN: 11 mg/dL (ref 8–23)
CO2: 24 mmol/L (ref 22–32)
Calcium: 8.7 mg/dL — ABNORMAL LOW (ref 8.9–10.3)
Chloride: 106 mmol/L (ref 98–111)
Creatinine, Ser: 1.13 mg/dL (ref 0.61–1.24)
GFR, Estimated: >60 mL/min (ref >60)
Glucose, Bld: 126 mg/dL — ABNORMAL HIGH (ref 70–99)
Potassium: 4 mmol/L (ref 3.5–5.1)
Sodium: 139 mmol/L (ref 135–145)
Total Bilirubin: 0.6 mg/dL (ref 0.3–1.2)
Total Protein: 6.4 g/dL — ABNORMAL LOW (ref 6.5–8.1)

## 2021-11-12 LAB — LACTIC ACID, PLASMA: Lactic Acid, Venous: 1.4 mmol/L (ref 0.5–1.9)

## 2021-11-12 LAB — GLUCOSE, CAPILLARY
Glucose-Capillary: 114 mg/dL — ABNORMAL HIGH (ref 70–99)
Glucose-Capillary: 142 mg/dL — ABNORMAL HIGH (ref 70–99)

## 2021-11-12 LAB — MAGNESIUM: Magnesium: 2 mg/dL (ref 1.7–2.4)

## 2021-11-12 LAB — PHOSPHORUS: Phosphorus: 3.3 mg/dL (ref 2.5–4.6)

## 2021-11-12 NOTE — Evaluation (Signed)
Occupational Therapy Evaluation Patient Details Name: Jeffery Ward MRN: [id number] DOB: June 20, 1933 Today's Date: 11/12/2021   History of Present Illness Pt admitted for nephrolithiasis s/p cystoscopy with L uretural stent placed 11/11/21. Pt with initial complaints of N/V and pain in L side. History includes CAD, kidney stones, obesity, and BPH.   Clinical Impression   Pt seen for OT evaluation this date in setting of acute hospitalization s/p L uretral stent. He presents with slight L flank pain, but it does not appear to be limiting his mobility and ADL performance significantly. On assessment of LB ADL, he tolerates well with no increased pain. He is able to get off commode with only cues for safe use of grab bar/RW as he does not use RW at baseline. He completes peri care with MOD I for increased time and grab bar for balance and standing hand washing with SUPV only. He completes fxl mobility back to recliner with distant SUPV to MOD I and requires only one cue to control descent to chair to reduce pain with transfer. Pt left with all needs met and in reach, RN passing meds and pt's family member present. No need for OT f/u perceived.      Recommendations for follow up therapy are one component of a multi-disciplinary discharge planning process, led by the attending physician.  Recommendations may be updated based on patient status, additional functional criteria and insurance authorization.   Follow Up Recommendations  No OT follow up    Assistance Recommended at Discharge None  Patient can return home with the following Assistance with cooking/housework    Functional Status Assessment  Patient has not had a recent decline in their functional status  Equipment Recommendations  None recommended by OT    Recommendations for Other Services       Precautions / Restrictions Precautions Precautions: Fall Precaution Comments: minimal fall risk Restrictions Weight Bearing  Restrictions: No      Mobility Bed Mobility               General bed mobility comments: up to bathroom pre, up to recliner post    Transfers Overall transfer level: Needs assistance Equipment used: Rolling walker (2 wheels) Transfers: Sit to/from Stand Sit to Stand: Supervision           General transfer comment: slight unsteadiness, some impulsivity, requires cues for safe use of RW as he does not use at home.      Balance Overall balance assessment: Mild deficits observed, not formally tested                                         ADL either performed or assessed with clinical judgement   ADL Overall ADL's : Modified independent;At baseline                                       General ADL Comments: assessed LB ADL and pt tolerates well despite post-op status, no c/o increased pain and demos appropriate ROM to complete self care w/o assist.     Vision Patient Visual Report: No change from baseline       Perception     Praxis      Pertinent Vitals/Pain Pain Assessment Pain Assessment: Faces Faces Pain Scale: Hurts little more Pain Location: L flank  Pain Descriptors / Indicators: Aching, Operative site guarding Pain Intervention(s): Limited activity within patient's tolerance, Monitored during session     Hand Dominance     Extremity/Trunk Assessment Upper Extremity Assessment Upper Extremity Assessment: Overall WFL for tasks assessed   Lower Extremity Assessment Lower Extremity Assessment: Overall WFL for tasks assessed       Communication Communication Communication: No difficulties   Cognition Arousal/Alertness: Awake/alert Behavior During Therapy: WFL for tasks assessed/performed Overall Cognitive Status: Within Functional Limits for tasks assessed                                 General Comments: HOH     General Comments       Exercises Other Exercises Other Exercises: OT ed with  pt and family re: role, safe use of RW as pt noted to pick it up.   Shoulder Instructions      Home Living Family/patient expects to be discharged to:: Private residence Living Arrangements: Spouse/significant other;Children Available Help at Discharge: Family;Available 24 hours/day Type of Home: House Home Access: Stairs to enter [employer] of Steps: 2 Entrance Stairs-Rails: Left Home Layout: One level               Home Equipment: Conservation officer, nature (2 wheels);Cane - single point;BSC/3in1          Prior Functioning/Environment Prior Level of Function : Independent/Modified Independent;Driving;History of Falls (last six months)             Mobility Comments: reports he has been active and indep. Reports 1 recent fall while transitioning out of truck, with mild concussion. ADLs Comments: indep with all ADLs        OT Problem List: Decreased activity tolerance      OT Treatment/Interventions:      OT Goals(Current goals can be found in the care plan section) Acute Rehab OT Goals Patient Stated Goal: to go home OT Goal Formulation: All assessment and education complete, DC therapy  OT Frequency:      Co-evaluation              AM-PAC OT "6 Clicks" Daily Activity     Outcome Measure Help from another person eating meals?: None Help from another person taking care of personal grooming?: None Help from another person toileting, which includes using toliet, bedpan, or urinal?: None Help from another person bathing (including washing, rinsing, drying)?: None Help from another person to put on and taking off regular upper body clothing?: None Help from another person to put on and taking off regular lower body clothing?: None 6 Click Score: 24   End of Session Equipment Utilized During Treatment: Rolling walker (2 wheels) Nurse Communication: Mobility status  Activity Tolerance: Patient tolerated treatment well Patient left: in chair;with call  bell/phone within reach;with family/visitor present  OT Visit Diagnosis: Other abnormalities of gait and mobility (R26.89);Pain Pain - part of body:  (flank)                Time: 4098-1191 OT Time Calculation (min): 8 min Charges:  OT General Charges $OT Visit: 1 Visit OT Evaluation $OT Eval Low Complexity: [place], MS, OTR/L ascom [phone] 11/12/21, 10:23 AM

## 2021-11-12 NOTE — Anesthesia Postprocedure Evaluation (Signed)
Anesthesia Post Note  Patient: Jeffery Ward  Procedure(s) Performed: CYSTOSCOPY WITH STENT PLACEMENT (Left)  Patient location during evaluation: PACU Anesthesia Type: General Level of consciousness: awake and alert, oriented and patient cooperative Pain management: pain level controlled Vital Signs Assessment: post-procedure vital signs reviewed and stable Respiratory status: spontaneous breathing, nonlabored ventilation and respiratory function stable Cardiovascular status: blood pressure returned to baseline and stable Postop Assessment: adequate PO intake Anesthetic complications: no   No notable events documented.   Last Vitals:  Vitals:   11/11/21 2213 11/12/21 0041  BP: (!) 142/60 133/75  Pulse: 83 90  Resp: 18 18  Temp: 36.5 C 36.5 C  SpO2: 97% 97%    Last Pain:  Vitals:   11/11/21 2213  TempSrc:   PainSc: 0-No pain                 Reed Breech

## 2021-11-12 NOTE — Evaluation (Signed)
Physical Therapy Evaluation Patient Details Name: Jeffery Ward MRN: [id number] DOB: [birth year] Today's Date: 11/12/2021  History of Present Illness  Pt admitted for nephrolithiasis s/p cystoscopy with L uretural stent placed 11/11/21. Pt with initial complaints of N/V and pain in L side. History includes CAD, kidney stones, obesity, and BPH.  Clinical Impression  Pt is a pleasant 88 year old male who was admitted for nephrolithiasis. Pt performs bed mobility, transfers, and ambulation with cga and RW. Pt typically indep at baseline, however feels more secure with use of AD at this time. Recommended to continue use of AD in home environment. Pt demonstrates deficits with balance/endurance. Would benefit from skilled PT to address above deficits and promote optimal return to PLOF. Will keep on PT caseload during acute stay to prevent deconditioning, however anticipate no further formalized PT needs at time of discharge. Has all equipment necessary in home environment in addition to supportive family.   Recommendations for follow up therapy are one component of a multi-disciplinary discharge planning process, led by the attending physician.  Recommendations may be updated based on patient status, additional functional criteria and insurance authorization.  Follow Up Recommendations No PT follow up    Assistance Recommended at Discharge Intermittent Supervision/Assistance  Patient can return home with the following  A little help with walking and/or transfers;Assist for transportation;Help with stairs or ramp for entrance    Equipment Recommendations None recommended by PT  Recommendations for Other Services       Functional Status Assessment Patient has had a recent decline in their functional status and demonstrates the ability to make significant improvements in function in a reasonable and predictable amount of time.     Precautions / Restrictions Precautions Precautions:  Fall Restrictions Weight Bearing Restrictions: No      Mobility  Bed Mobility Overal bed mobility: Needs Assistance Bed Mobility: Supine to Sit     Supine to sit: Min guard     General bed mobility comments: has difficulty using hands due to IV placement. Once seated at EOB, upright posture noted. No dizziness    Transfers Overall transfer level: Needs assistance Equipment used: Rolling walker (2 wheels) Transfers: Sit to/from Stand Sit to Stand: Min guard           General transfer comment: able to stand effectively from standard height bed. RW used due to unsteadiness, improved with AD use    Ambulation/Gait Ambulation/Gait assistance: Min guard Gait Distance (Feet): 60 Feet Assistive device: Rolling walker (2 wheels) Gait Pattern/deviations: Step-through pattern       General Gait Details: ambulated in room, per patient request. RW used with safe technique. Able to complete turns without LOB and minimal pain. Pt deferred further ambulation  Stairs            Wheelchair Mobility    Modified Rankin (Stroke Patients Only)       Balance Overall balance assessment: Mild deficits observed, not formally tested                                           Pertinent Vitals/Pain Pain Assessment Pain Assessment: Faces Faces Pain Scale: Hurts little more Pain Location: L flank Pain Descriptors / Indicators: Aching, Operative site guarding Pain Intervention(s): Limited activity within patient's tolerance, Premedicated before session, Repositioned    Home Living Family/patient expects to be discharged to:: Private residence Living Arrangements: Spouse/significant  other;Children Available Help at Discharge: Family;Available 24 hours/day Type of Home: House Home Access: Stairs to enter Entrance Stairs-Rails: Left Entrance Stairs-Number of Steps: 2   Home Layout: One level Home Equipment: Agricultural consultant (2 wheels);Cane - single  point;BSC/3in1      Prior Function Prior Level of Function : Independent/Modified Independent;Driving;History of Falls (last six months)             Mobility Comments: reports he has been active and indep. Reports 1 recent fall while transitioning out of truck, with mild concussion. ADLs Comments: indep with all ADLs     Hand Dominance        Extremity/Trunk Assessment   Upper Extremity Assessment Upper Extremity Assessment: Overall WFL for tasks assessed    Lower Extremity Assessment Lower Extremity Assessment: Overall WFL for tasks assessed       Communication   Communication: No difficulties  Cognition Arousal/Alertness: Awake/alert Behavior During Therapy: WFL for tasks assessed/performed Overall Cognitive Status: Within Functional Limits for tasks assessed                                 General Comments: HOH        General Comments      Exercises     Assessment/Plan    PT Assessment Patient needs continued PT services  PT Problem List Decreased balance;Decreased mobility;Pain       PT Treatment Interventions Gait training;DME instruction;Stair training;Balance training    PT Goals (Current goals can be found in the Care Plan section)  Acute Rehab PT Goals Patient Stated Goal: to gohome PT Goal Formulation: With patient Time For Goal Achievement: 11/26/21 Potential to Achieve Goals: Good    Frequency Min 2X/week     Co-evaluation               AM-PAC PT "6 Clicks" Mobility  Outcome Measure Help needed turning from your back to your side while in a flat bed without using bedrails?: None Help needed moving from lying on your back to sitting on the side of a flat bed without using bedrails?: None Help needed moving to and from a bed to a chair (including a wheelchair)?: A Little Help needed standing up from a chair using your arms (e.g., wheelchair or bedside chair)?: A Little Help needed to walk in hospital room?: A  Little Help needed climbing 3-5 steps with a railing? : A Little 6 Click Score: 20    End of Session Equipment Utilized During Treatment: Gait belt Activity Tolerance: Patient tolerated treatment well Patient left: in chair;with chair alarm set;with family/visitor present Nurse Communication: Mobility status PT Visit Diagnosis: Unsteadiness on feet (R26.81);Difficulty in walking, not elsewhere classified (R26.2);Pain Pain - Right/Left: Left Pain - part of body:  (flank)    Time: 5883-2549 PT Time Calculation (min) (ACUTE ONLY): 19 min   Charges:   PT Evaluation $PT Eval Low Complexity: 1 Low PT Treatments $Gait Training: 8-22 mins        Elizabeth Palau, PT, DPT, GCS [phone]   Braxton Weisbecker 11/12/2021, 9:53 AM

## 2021-11-12 NOTE — Progress Notes (Signed)
Surgical Physician Order Form [facility] [place]  * Scheduling expectation :  2 to 3 weeks with Dr. Lonna Cobb   *Length of Case:   *Clearance needed: yes  *Anticoagulation Instructions: Hold all anticoagulants  *Aspirin Instructions: Hold Aspirin and Plavix  *Post-op visit Date/Instructions:  1 month with RUS prior  *Diagnosis: Bladder Stone, left ureteral stone   *Procedure: left Cystolitholapaxy >2.5cm (02725) and left ureteroscopy with laser lithotripsy/stone removal and stent exchange    Additional orders: N/A  -Admit type: OUTpatient  -Anesthesia: General  -VTE Prophylaxis Standing Order SCDs       Other:   -Standing Lab Orders Per Anesthesia    Lab other: UA&Urine Culture  -Standing Test orders EKG/Chest x-ray per Anesthesia       Test other:   - Medications:  Cipro 400mg  IV  -Other orders:  N/A

## 2021-11-12 NOTE — Progress Notes (Signed)
Urology Consult Follow Up  Subjective: He is having some mild stent discomfort this morning.    VSS afebrile  Lactic acid is 1.4 down from 2.7 yesterday.  WBC count stable at 6.4.  Serum creatinine down to 1.13 from 1.36 yesterday.  UA from yesterday 11-20 RBCs.  Urine culture is pending.    Anti-infectives: Anti-infectives (From admission, onward)    Start     Dose/Rate Route Frequency Ordered Stop   11/12/21 1800  cefTRIAXone (ROCEPHIN) 2 g in sodium chloride 0.9 % 100 mL IVPB        2 g 200 mL/hr over 30 Minutes Intravenous Every 24 hours 11/11/21 1821     11/11/21 1715  cefTRIAXone (ROCEPHIN) 1 g in sodium chloride 0.9 % 100 mL IVPB        1 g 200 mL/hr over 30 Minutes Intravenous  Once 11/11/21 1708 11/11/21 1746       Current Facility-Administered Medications  Medication Dose Route Frequency Provider Last Rate Last Admin   0.9 %  sodium chloride infusion   Intravenous Continuous Eugenie Filler, MD   Stopped at 11/12/21 0035   acetaminophen (TYLENOL) tablet 650 mg  650 mg Oral Q6H PRN Eugenie Filler, MD       Or   acetaminophen (TYLENOL) suppository 650 mg  650 mg Rectal Q6H PRN Eugenie Filler, MD       albuterol (PROVENTIL) (2.5 MG/3ML) 0.083% nebulizer solution 2.5 mg  2.5 mg Nebulization Q2H PRN Eugenie Filler, MD       amLODipine (NORVASC) tablet 2.5 mg  2.5 mg Oral Neville Route, MD   2.5 mg at 11/11/21 2214   ascorbic acid (VITAMIN C) tablet 500 mg  500 mg Oral Daily Eugenie Filler, MD   500 mg at 11/11/21 2212   aspirin EC tablet 81 mg  81 mg Oral Daily Eugenie Filler, MD       busPIRone (BUSPAR) tablet 5 mg  5 mg Oral Daily PRN Eugenie Filler, MD       cefTRIAXone (ROCEPHIN) 2 g in sodium chloride 0.9 % 100 mL IVPB  2 g Intravenous Q24H Eugenie Filler, MD       ferrous sulfate tablet 324 mg  324 mg Oral QHS Eugenie Filler, MD       fluticasone (FLONASE) 50 MCG/ACT nasal spray 1 spray  1 spray Each Nare Daily PRN Eugenie Filler, MD       hydrALAZINE (APRESOLINE) injection 5 mg  5 mg Intravenous Q6H PRN Eugenie Filler, MD       ketorolac (TORADOL) 30 MG/ML injection 15 mg  15 mg Intravenous Q6H PRN Eugenie Filler, MD       morphine (PF) 2 MG/ML injection 2 mg  2 mg Intravenous Q3H PRN Merlyn Lot, MD   2 mg at 11/11/21 1602   morphine (PF) 4 MG/ML injection 4 mg  4 mg Intravenous Q3H PRN Merlyn Lot, MD       ondansetron [facility]) tablet 4 mg  4 mg Oral Q6H PRN Eugenie Filler, MD       Or   ondansetron [employer]) injection 4 mg  4 mg Intravenous Q6H PRN Eugenie Filler, MD       oxyCODONE (Oxy IR/ROXICODONE) immediate release tablet 5 mg  5 mg Oral Q4H PRN Eugenie Filler, MD   5 mg at 11/12/21 0742   pantoprazole (PROTONIX) EC tablet 40 mg  40 mg  Oral Daily Eugenie Filler, MD       polyethylene glycol [facility] / GLYCOLAX) packet 17 g  17 g Oral Daily PRN Eugenie Filler, MD       potassium citrate (UROCIT-K) SR tablet 10 mEq  10 mEq Oral Daily Eugenie Filler, MD       senna (SENOKOT) tablet 8.6 mg  1 tablet Oral BID Eugenie Filler, MD       sodium chloride 0.9 % bolus 500 mL  500 mL Intravenous Once Eugenie Filler, MD       sodium chloride flush (NS) 0.9 % injection 3 mL  3 mL Intravenous Q12H Eugenie Filler, MD       sorbitol 70 % solution 30 mL  30 mL Oral Daily PRN Eugenie Filler, MD         Objective: Vital signs in last 24 hours: Temp:  [97.4 F (36.3 C)-98.2 F (36.8 C)] 98.2 F (36.8 C) (02/22 0736) Pulse Rate:  [61-92] 78 (02/22 0736) Resp:  [13-22] 16 (02/22 0809) BP: (91-171)/(52-77) 91/69 (02/22 0809) SpO2:  [94 %-100 %] 95 % (02/22 0809) Weight:  [108.6 kg] 108.6 kg (02/21 1532)  Intake/Output from previous day: 02/21 0701 - 02/22 0700 In: 500 [I.V.:500] Out: 650 [Urine:650] Intake/Output this shift: Total I/O In: 237.8 [I.V.:237.8] Out: 50 [Urine:50]   Physical Exam Constitutional:  Well nourished. Alert and oriented, No  acute distress. HEENT: [place] AT, moist mucus membranes.  Trachea midline Cardiovascular: No clubbing, cyanosis, or edema. Respiratory: Normal respiratory effort, no increased work of breathing. GU: No CVA tenderness.  No bladder fullness or masses.   Neurologic: Grossly intact, no focal deficits, moving all 4 extremities. Psychiatric: Normal mood and affect.   Lab Results:  Recent Labs    11/11/21 1539 11/12/21 0504  WBC 5.6 6.4  HGB 13.0 12.1*  HCT 38.9* 36.5*  PLT 198 181   BMET Recent Labs    11/11/21 1539 11/12/21 0504  NA 137 139  K 3.6 4.0  CL 105 106  CO2 20* 24  GLUCOSE 162* 126*  BUN 13 11  CREATININE 1.36* 1.13  CALCIUM 9.3 8.7*   PT/INR No results for input(s): LABPROT, INR in the last 72 hours. ABG No results for input(s): PHART, HCO3 in the last 72 hours.  Invalid input(s): PCO2, PO2  Studies/Results: DG OR UROLOGY CYSTO IMAGE ([facility] ONLY)  Result Date: 11/11/2021 There is no interpretation for this exam.  This order is for images obtained during a surgical procedure.  Please See "Surgeries" Tab for more information regarding the procedure.   CT Renal Stone Study  Result Date: 11/11/2021 CLINICAL DATA:  Bilateral flank and lower abdominal pain EXAM: CT ABDOMEN AND PELVIS WITHOUT CONTRAST TECHNIQUE: Multidetector CT imaging of the abdomen and pelvis was performed following the standard protocol without IV contrast. RADIATION DOSE REDUCTION: This exam was performed according to the departmental dose-optimization program which includes automated exposure control, adjustment of the mA and/or kV according to patient size and/or use of iterative reconstruction technique. COMPARISON:  Jan 25, 2014 FINDINGS: Lower chest: Evaluation is limited by respiratory motion. Hepatobiliary: No suspicious hepatic lesion on this noncontrast examination. Gallbladder is unremarkable. No biliary ductal dilation. Pancreas: No pancreatic ductal dilation or evidence of acute inflammation.  Spleen: Normal in size without focal abnormality. Adrenals/Urinary Tract: Bilateral adrenal glands appear normal. Mild left hydroureteronephrosis to the level of a 2 mm stone in the distal ureter just proximal to the ureterovesicular junction. Additional  punctate nonobstructive bilateral nephrolithiasis. 13 mm bladder calculus. Mild symmetric wall thickening of a nondistended urinary bladder. Stomach/Bowel: No enteric contrast was administered. Small to moderate hiatal hernia. Stomach is unremarkable for degree of distension. No pathologic dilation of small or large bowel. The appendix and terminal ileum appear normal. Small volume of formed stool throughout the colon. No evidence of acute bowel inflammation. Vascular/Lymphatic: Aortic and branch vessel atherosclerosis without abdominal aortic aneurysm. No pathologically enlarged abdominal or pelvic lymph nodes. Reproductive: Prostate is unremarkable. Other: No significant abdominopelvic free fluid. Musculoskeletal: Diffuse demineralization of bone. Multilevel degenerative changes spine with grade 1 degenerative L4 on L5 anterolisthesis. Mild degenerative change of the hips and SI joints. IMPRESSION: 1. Mild left hydroureteronephrosis to the level of a 2 mm stone in the distal left ureter just proximal to the ureterovesicular junction. 2. Additional punctate nonobstructive bilateral nephrolithiasis. 3. 13 mm bladder calculus. 4. Small to moderate hiatal hernia. 5.  Aortic Atherosclerosis (ICD10-I70.0). Electronically Signed   By: Dahlia Bailiff M.D.   On: 11/11/2021 16:37     Assessment: 88 year old male who presented to the emergency department yesterday after acute onset of left-sided flank pain and found to have an elevated lactate level and a 2 to 3 mm distal ureteral calculus with mild hydronephrosis/hydroureter along with nonobstructing left renal calculus and a 13 mm bladder calculus found on stone protocol CT who underwent emergent left ureteral stent  placement yesterday with Dr. Bernardo Heater.  Plan: -recommend oxybutynin IR 5 mg TID prn and tamsulosin 0.4 mg daily for stent discomfort -stable for discharge from urological standpoint  -will need 10 days of antibiotic  -will need outpatient ureteroscopy with stone removal and cystolitholapaxy in 2-3 weeks, we will arrange      LOS: 0 days    [facility] 11/12/2021

## 2021-11-12 NOTE — Progress Notes (Signed)
PROGRESS NOTE    Jeffery Ward  [id number] DOB: 11-04-1932 DOA: 11/11/2021 PCP: [facility]    Assessment & Plan:   Principal Problem:   Nephrolithiasis Active Problems:   BPH with obstruction/lower urinary tract symptoms   Hydroureteronephrosis   Dehydration   Lactic acidosis   GERD (gastroesophageal reflux disease)   CAD (coronary artery disease)   Hx of CABG   Nephrolithiasis: w/ mild left hydroureteronephrosis to the level of a 2 mm stone in the distal left ureter just proximal to the ureteral vesicular junction, additional punctuate nonobstructing bilateral nephrolithiasis as per CT scan. S/p cystoscopy w/ placement of left ureteral stent. W/ hematuria today. Continue on IV rocephin. Urine cx is pending. Urology following and recs apprec    Hydroureteronephrosis: secondary to above. Urology following and recs apprec    Dehydration: continue on IVFs   Hx of CAD: s/p CABG. Continue on metoprolol, aspirin, irbesartan    GERD: continue on PPI  Lactic acidosis: resolved    BPH: with obstruction. Management as stated above   DVT prophylaxis: SCDs Code Status: full  Family Communication: discussed pt's care w/ pt's family at bedside and answered their questions  Disposition Plan: likely d/c home tomorrow   Level of care: Telemetry Medical  Status is: Observation The patient remains OBS appropriate and will d/c before 2 midnights. Hematuria today and urine cx is pending still    Consultants:  Urology   Procedures: cystoscopy w/ stent placement   Antimicrobials: rocephin   Subjective: Pt c/o blood in urine   Objective: Vitals:   11/11/21 2213 11/12/21 0041 11/12/21 0447 11/12/21 0736  BP: (!) 142/60 133/75 121/67 (!) 141/68  Pulse: 83 90 81 78  Resp: 18 18 16 18   Temp: 97.7 F (36.5 C) 97.7 F (36.5 C) 97.7 F (36.5 C) 98.2 F (36.8 C)  TempSrc:    Oral  SpO2: 97% 97% 94% 95%  Weight:      Height:        Intake/Output Summary (Last  24 hours) at 11/12/2021 0745 Last data filed at 11/12/2021 0738 Gross per 24 hour  Intake 737.78 ml  Output 650 ml  Net 87.78 ml   Filed Weights   11/11/21 1532  Weight: 108.6 kg    Examination:  General exam: Appears calm and comfortable  Respiratory system: Clear to auscultation. Respiratory effort normal. Cardiovascular system: S1 & S2+. No rubs, gallops or clicks. Gastrointestinal system: Abdomen is nondistended, soft and nontender. Normal bowel sounds heard. Central nervous system: Alert and oriented. Moves all extremities  Psychiatry: Judgement and insight appear normal. Mood & affect appropriate.     Data Reviewed: I have personally reviewed following labs and imaging studies  CBC: Recent Labs  Lab 11/11/21 1539 11/12/21 0504  WBC 5.6 6.4  NEUTROABS  --  4.8  HGB 13.0 12.1*  HCT 38.9* 36.5*  MCV 91.5 93.1  PLT 198 0000000   Basic Metabolic Panel: Recent Labs  Lab 11/11/21 1539 11/12/21 0504  NA 137 139  K 3.6 4.0  CL 105 106  CO2 20* 24  GLUCOSE 162* 126*  BUN 13 11  CREATININE 1.36* 1.13  CALCIUM 9.3 8.7*  MG  --  2.0  PHOS  --  3.3   GFR: Estimated Creatinine Clearance: 56.1 mL/min (by C-G formula based on SCr of 1.13 mg/dL). Liver Function Tests: Recent Labs  Lab 11/11/21 1539 11/12/21 0504  AST 27 23  ALT 17 14  ALKPHOS 45 40  BILITOT 0.7  0.6  PROT 7.4 6.4*  ALBUMIN 4.2 3.6   Recent Labs  Lab 11/11/21 1539  LIPASE 30   No results for input(s): AMMONIA in the last 168 hours. Coagulation Profile: No results for input(s): INR, PROTIME in the last 168 hours. Cardiac Enzymes: No results for input(s): CKTOTAL, CKMB, CKMBINDEX, TROPONINI in the last 168 hours. BNP (last 3 results) No results for input(s): PROBNP in the last 8760 hours. HbA1C: No results for input(s): HGBA1C in the last 72 hours. CBG: Recent Labs  Lab 11/12/21 0739  GLUCAP 114*   Lipid Profile: No results for input(s): CHOL, HDL, LDLCALC, TRIG, CHOLHDL, LDLDIRECT in  the last 72 hours. Thyroid Function Tests: No results for input(s): TSH, T4TOTAL, FREET4, T3FREE, THYROIDAB in the last 72 hours. Anemia Panel: No results for input(s): VITAMINB12, FOLATE, FERRITIN, TIBC, IRON, RETICCTPCT in the last 72 hours. Sepsis Labs: Recent Labs  Lab 11/11/21 1539 11/12/21 0504  LATICACIDVEN 2.7* 1.4    Recent Results (from the past 240 hour(s))  Resp Panel by RT-PCR (Flu A&B, Covid) Nasopharyngeal Swab     Status: None   Collection Time: 11/11/21  5:18 PM   Specimen: Nasopharyngeal Swab; Nasopharyngeal(NP) swabs in vial transport medium  Result Value Ref Range Status   SARS Coronavirus 2 by RT PCR NEGATIVE NEGATIVE Final    Comment: (NOTE) SARS-CoV-2 target nucleic acids are NOT DETECTED.  The SARS-CoV-2 RNA is generally detectable in upper respiratory specimens during the acute phase of infection. The lowest concentration of SARS-CoV-2 viral copies this assay can detect is 138 copies/mL. A negative result does not preclude SARS-Cov-2 infection and should not be used as the sole basis for treatment or other patient management decisions. A negative result may occur with  improper specimen collection/handling, submission of specimen other than nasopharyngeal swab, presence of viral mutation(s) within the areas targeted by this assay, and inadequate number of viral copies(<138 copies/mL). A negative result must be combined with clinical observations, patient history, and epidemiological information. The expected result is Negative.  Fact Sheet for Patients:  EntrepreneurPulse.com.au  Fact Sheet for Healthcare Providers:  IncredibleEmployment.be  This test is no t yet approved or cleared by the [place] FDA and  has been authorized for detection and/or diagnosis of SARS-CoV-2 by FDA under an Emergency Use Authorization (EUA). This EUA will remain  in effect (meaning this test can be used) for the duration of  the COVID-19 declaration under Section 564(b)(1) of the Act, 21 U.S.C.section 360bbb-3(b)(1), unless the authorization is terminated  or revoked sooner.       Influenza A by PCR NEGATIVE NEGATIVE Final   Influenza B by PCR NEGATIVE NEGATIVE Final    Comment: (NOTE) The Xpert Xpress SARS-CoV-2/FLU/RSV plus assay is intended as an aid in the diagnosis of influenza from Nasopharyngeal swab specimens and should not be used as a sole basis for treatment. Nasal washings and aspirates are unacceptable for Xpert Xpress SARS-CoV-2/FLU/RSV testing.  Fact Sheet for Patients: EntrepreneurPulse.com.au  Fact Sheet for Healthcare Providers: IncredibleEmployment.be  This test is not yet approved or cleared by the [place] FDA and has been authorized for detection and/or diagnosis of SARS-CoV-2 by FDA under an Emergency Use Authorization (EUA). This EUA will remain in effect (meaning this test can be used) for the duration of the COVID-19 declaration under Section 564(b)(1) of the Act, 21 U.S.C. section 360bbb-3(b)(1), unless the authorization is terminated or revoked.  Performed at [facility], [address] [place], [place] [address]  Radiology Studies: DG OR UROLOGY CYSTO IMAGE ([place])  Result Date: 11/11/2021 There is no interpretation for this exam.  This order is for images obtained during a surgical procedure.  Please See "Surgeries" Tab for more information regarding the procedure.   CT Renal Stone Study  Result Date: 11/11/2021 CLINICAL DATA:  Bilateral flank and lower abdominal pain EXAM: CT ABDOMEN AND PELVIS WITHOUT CONTRAST TECHNIQUE: Multidetector CT imaging of the abdomen and pelvis was performed following the standard protocol without IV contrast. RADIATION DOSE REDUCTION: This exam was performed according to the departmental dose-optimization program which includes automated exposure control, adjustment of  the mA and/or kV according to patient size and/or use of iterative reconstruction technique. COMPARISON:  Jan 25, 2014 FINDINGS: Lower chest: Evaluation is limited by respiratory motion. Hepatobiliary: No suspicious hepatic lesion on this noncontrast examination. Gallbladder is unremarkable. No biliary ductal dilation. Pancreas: No pancreatic ductal dilation or evidence of acute inflammation. Spleen: Normal in size without focal abnormality. Adrenals/Urinary Tract: Bilateral adrenal glands appear normal. Mild left hydroureteronephrosis to the level of a 2 mm stone in the distal ureter just proximal to the ureterovesicular junction. Additional punctate nonobstructive bilateral nephrolithiasis. 13 mm bladder calculus. Mild symmetric wall thickening of a nondistended urinary bladder. Stomach/Bowel: No enteric contrast was administered. Small to moderate hiatal hernia. Stomach is unremarkable for degree of distension. No pathologic dilation of small or large bowel. The appendix and terminal ileum appear normal. Small volume of formed stool throughout the colon. No evidence of acute bowel inflammation. Vascular/Lymphatic: Aortic and branch vessel atherosclerosis without abdominal aortic aneurysm. No pathologically enlarged abdominal or pelvic lymph nodes. Reproductive: Prostate is unremarkable. Other: No significant abdominopelvic free fluid. Musculoskeletal: Diffuse demineralization of bone. Multilevel degenerative changes spine with grade 1 degenerative L4 on L5 anterolisthesis. Mild degenerative change of the hips and SI joints. IMPRESSION: 1. Mild left hydroureteronephrosis to the level of a 2 mm stone in the distal left ureter just proximal to the ureterovesicular junction. 2. Additional punctate nonobstructive bilateral nephrolithiasis. 3. 13 mm bladder calculus. 4. Small to moderate hiatal hernia. 5.  Aortic Atherosclerosis (ICD10-I70.0). Electronically Signed   By: Dahlia Bailiff M.D.   On: 11/11/2021 16:37         Scheduled Meds:  amLODipine  2.5 mg Oral QODAY   vitamin C  500 mg Oral Daily   aspirin EC  81 mg Oral Daily   ferrous sulfate  324 mg Oral QHS   pantoprazole  40 mg Oral Daily   potassium citrate  10 mEq Oral Daily   senna  1 tablet Oral BID   sodium chloride flush  3 mL Intravenous Q12H   Continuous Infusions:  sodium chloride Stopped (11/12/21 0035)   cefTRIAXone (ROCEPHIN)  IV     sodium chloride       LOS: 0 days    Time spent: 33 mins     Wyvonnia Dusky, MD [facility] Pager [phone]  If 7PM-7AM, please contact night-coverage 11/12/2021, 7:45 AM

## 2021-11-12 NOTE — Care Management (Signed)
°  Transition of Care [facility]) Screening Note   Patient Details  Name: Damont Jankiewicz Date of Birth: 09-09-1933   Transition of Care [facility]) CM/SW Contact:    Pete Pelt, RN Phone Number: 11/12/2021, 10:51 AM    Transition of Care Department [facility]) has reviewed patient and no TOC needs have been identified at this time. We will continue to monitor patient advancement through interdisciplinary progression rounds. If new patient transition needs arise, please place a TOC consult.

## 2021-11-12 NOTE — Telephone Encounter (Signed)
Mr. Jeffery Ward will need an appointment with either me or Dr. Lonna Cobb prior to his procedure.

## 2021-11-13 DIAGNOSIS — N200 Calculus of kidney: Secondary | ICD-10-CM | POA: Diagnosis not present

## 2021-11-13 DIAGNOSIS — N401 Enlarged prostate with lower urinary tract symptoms: Secondary | ICD-10-CM | POA: Diagnosis not present

## 2021-11-13 DIAGNOSIS — N138 Other obstructive and reflux uropathy: Secondary | ICD-10-CM | POA: Diagnosis not present

## 2021-11-13 LAB — URINE CULTURE
Culture: <10000 — AB
Culture: 1000 — AB

## 2021-11-13 LAB — BASIC METABOLIC PANEL
Anion gap: 7 (ref 5–15)
BUN: 12 mg/dL (ref 8–23)
CO2: 24 mmol/L (ref 22–32)
Calcium: 8.7 mg/dL — ABNORMAL LOW (ref 8.9–10.3)
Chloride: 109 mmol/L (ref 98–111)
Creatinine, Ser: 1.31 mg/dL — ABNORMAL HIGH (ref 0.61–1.24)
GFR, Estimated: 53 mL/min — ABNORMAL LOW (ref >60)
Glucose, Bld: 100 mg/dL — ABNORMAL HIGH (ref 70–99)
Potassium: 3.8 mmol/L (ref 3.5–5.1)
Sodium: 140 mmol/L (ref 135–145)

## 2021-11-13 LAB — CBC
HCT: 34.4 % — ABNORMAL LOW (ref 39.0–52.0)
Hemoglobin: 11.5 g/dL — ABNORMAL LOW (ref 13.0–17.0)
MCH: 30.7 pg (ref 26.0–34.0)
MCHC: 33.4 g/dL (ref 30.0–36.0)
MCV: 92 fL (ref 80.0–100.0)
Platelets: 167 10*3/uL (ref 150–400)
RBC: 3.74 MIL/uL — ABNORMAL LOW (ref 4.22–5.81)
RDW: 14.4 % (ref 11.5–15.5)
WBC: 5.9 10*3/uL (ref 4.0–10.5)
nRBC: 0 % (ref 0.0–0.2)

## 2021-11-13 MED ORDER — OXYCODONE HCL 5 MG PO TABS: 5.0000 mg | ORAL_TABLET | Freq: Four times a day (QID) | ORAL | 0 refills | Status: AC | PRN

## 2021-11-13 MED ORDER — TAMSULOSIN HCL 0.4 MG PO CAPS: 0.4000 mg | ORAL_CAPSULE | Freq: Every day | ORAL | 0 refills | Status: DC

## 2021-11-13 MED ORDER — OXYBUTYNIN CHLORIDE 5 MG PO TABS: 5.0000 mg | ORAL_TABLET | Freq: Three times a day (TID) | ORAL | 0 refills | Status: DC | PRN

## 2021-11-13 MED ORDER — CEFDINIR 300 MG PO CAPS: 300.0000 mg | ORAL_CAPSULE | Freq: Two times a day (BID) | ORAL | 0 refills | Status: AC

## 2021-11-13 NOTE — Progress Notes (Signed)
Mobility Specialist - Progress Note    11/13/21 1244  Mobility  Activity Refused mobility   Pt refused session d/t upcoming D/C. Will return for assistance with pericare at a later time. Pt left with needs in reach and family at bedside.  Clarisa Schools Mobility Specialist 11/13/21, 12:45 PM

## 2021-11-13 NOTE — Plan of Care (Signed)

## 2021-11-13 NOTE — Discharge Summary (Signed)
Physician Discharge Summary  Jeffery Ward [id number] DOB: 1933/03/10 DOA: 11/11/2021  PCP: [facility]  Admit date: 11/11/2021 Discharge date: 11/13/2021  Admitted From: home Disposition:  home  Recommendations for Outpatient Follow-up:  Follow up with PCP in 1-2 weeks F/u w/ urology, Dr. Bernardo Heater, in 2-3 weeks  Home Health: no  Equipment/Devices:  Discharge Condition: stable  CODE STATUS: full  Diet recommendation: Heart Healthy   Brief/Interim Summary: HPI was taken from Dr. Vira Agar: Jeffery Ward is a 88 y.o. male with medical history significant of coronary artery disease status post CABG, low testosterone, GERD, remote history of kidney stones presented to the ED with sudden onset left flank and left back pain started around 1300 hrs. on the day of admission described as an excruciating pain with associated nausea vomiting and diaphoresis, chills.  Patient denies any chest pain, no shortness of breath, no dysuria, no hematuria, no syncopal episodes.  Patient denies any melena, no hematemesis, no hematochezia.  No fever, no significant abdominal pain, no diarrhea, no constipation.  Patient stated had similar pain with his last kidney stone. ED Course: Patient seen in the ED, CT renal stone protocol done concerning for mild left hydroureteronephrosis to the level of a 2 mm stone in the distal left ureter just proximal to the ureteral vesicular junction, additional punctuate nonobstructive bilateral nephrolithiasis, 13 mm bladder calculus.  Lactic acid level noted elevated at 2.7.  Comprehensive metabolic profile with a bicarb of 20, glucose of 162, creatinine of 1.36 otherwise was within normal limits.  CBC done with a hematocrit of 38.9 otherwise was within normal limits.  Patient given IV pain medication, IV Rocephin.  EDP stated consulted with urology who is taking patient to the OR for cystoscopy and possible stent placement.   As per Dr. Jimmye Norman 2/22-2/23/23: Pt was  s/p cystoscopy w/ placement of left ureteral stent. Pt had hematuria on 2/22 and which resolved the following day. Pt was on IV rocephin while inpatient and d/c home on po cefdinir as urine cx was pending at the time of d/c. Pt will f/u w/ urology in 2-3 weeks for outpatient ureteroscopy w/ stone removal & cystolitholapaxy   Discharge Diagnoses:  Principal Problem:   Nephrolithiasis Active Problems:   BPH with obstruction/lower urinary tract symptoms   Hydroureteronephrosis   Dehydration   Lactic acidosis   GERD (gastroesophageal reflux disease)   CAD (coronary artery disease)   Hx of CABG  Nephrolithiasis: w/ mild left hydroureteronephrosis to the level of a 2 mm stone in the distal left ureter just proximal to the ureteral vesicular junction, additional punctuate nonobstructing bilateral nephrolithiasis as per CT scan. S/p cystoscopy w/ placement of left ureteral stent. W/ hematuria today. Continue on IV rocephin while inpatient & d/c home w/ po cefdinir to finish the course. Urine cx is pending. Urology following and recs apprec    Hydroureteronephrosis: secondary to above. Urology following and recs apprec    Dehydration: resolved   Hx of CAD: s/p CABG. Continue on metoprolol, aspirin, irbesartan    GERD: continue on PPI   Lactic acidosis: resolved    BPH: with obstruction. Management as stated above  Discharge Instructions  Discharge Instructions     Diet - low sodium heart healthy   Complete by: As directed    Discharge instructions   Complete by: As directed    F/u w/ urology, Dr. Bernardo Heater, in 2-3 weeks. F/u w/ PCP in 1-2 weeks   Increase activity slowly   Complete by:  As directed    No wound care   Complete by: As directed       Allergies as of 11/13/2021       Reactions   Amoxicillin Other (See Comments)   Azithromycin Diarrhea   Penicillins Other (See Comments)        Medication List     STOP taking these medications    clomiPHENE 50 MG  tablet Commonly known as: CLOMID   metoprolol tartrate 25 MG tablet Commonly known as: LOPRESSOR   telmisartan 20 MG tablet Commonly known as: MICARDIS   terbinafine 250 MG tablet Commonly known as: LAMISIL   Testosterone 75 MG Pllt       TAKE these medications    amLODipine 5 MG tablet Commonly known as: NORVASC Take 2.5 mg by mouth every other day.   aspirin 81 MG EC tablet Take 81 mg by mouth daily. Swallow whole.   busPIRone 5 MG tablet Commonly known as: BUSPAR Take 5 mg by mouth daily as needed.   cefdinir 300 MG capsule Commonly known as: OMNICEF Take 1 capsule (300 mg total) by mouth 2 (two) times daily for 7 days.   clopidogrel 75 MG tablet Commonly known as: PLAVIX   CoQ10 200 MG Caps Take 1 capsule by mouth daily.   ferrous sulfate 324 MG Tbec Take 1 tablet by mouth at bedtime.   fluticasone 50 MCG/ACT nasal spray Commonly known as: FLONASE   metoprolol succinate 25 MG 24 hr tablet Commonly known as: TOPROL-XL Take 25 mg by mouth daily. What changed: Another medication with the same name was removed. Continue taking this medication, and follow the directions you see here.   neomycin-polymyxin b-dexamethasone 3.5-10000-0.1 Susp Commonly known as: MAXITROL Reported on 09/10/2015   oxybutynin 5 MG tablet Commonly known as: DITROPAN Take 1 tablet (5 mg total) by mouth every 8 (eight) hours as needed for bladder spasms.   oxyCODONE 5 MG immediate release tablet Commonly known as: Oxy IR/ROXICODONE Take 1 tablet (5 mg total) by mouth every 6 (six) hours as needed for up to 5 days for moderate pain or severe pain. What changed:  how much to take how to take this when to take this reasons to take this additional instructions   pantoprazole 40 MG tablet Commonly known as: PROTONIX Take by mouth.   potassium citrate 10 MEQ (1080 MG) SR tablet Commonly known as: UROCIT-K Take 10 mEq by mouth daily.   rosuvastatin 20 MG tablet Commonly  known as: CRESTOR Take by mouth. What changed: Another medication with the same name was removed. Continue taking this medication, and follow the directions you see here.   tamsulosin 0.4 MG Caps capsule Commonly known as: FLOMAX Take 1 capsule (0.4 mg total) by mouth daily.   vitamin C 250 MG tablet Commonly known as: ASCORBIC ACID Take 500 mg by mouth daily.        Follow-up Information     Stoioff, Ronda Fairly, MD Follow up in 2 week(s).   Specialty: Urology Why: F/u w/ urology in 2-3 weeks Contact information: [address] [address] [place] Follow up in 1 week(s).   Why: F/u w/ PCP in 1-2 weeks Contact information: [address] [place] [address] [phone]                Allergies  Allergen Reactions   Amoxicillin Other (See Comments)   Azithromycin Diarrhea  Penicillins Other (See Comments)    Consultations: Urology: Dr. Bernardo Heater    Procedures/Studies: DG OR UROLOGY CYSTO IMAGE ([facility])  Result Date: 11/11/2021 There is no interpretation for this exam.  This order is for images obtained during a surgical procedure.  Please See "Surgeries" Tab for more information regarding the procedure.   CT Renal Stone Study  Result Date: 11/11/2021 CLINICAL DATA:  Bilateral flank and lower abdominal pain EXAM: CT ABDOMEN AND PELVIS WITHOUT CONTRAST TECHNIQUE: Multidetector CT imaging of the abdomen and pelvis was performed following the standard protocol without IV contrast. RADIATION DOSE REDUCTION: This exam was performed according to the departmental dose-optimization program which includes automated exposure control, adjustment of the mA and/or kV according to patient size and/or use of iterative reconstruction technique. COMPARISON:  Jan 25, 2014 FINDINGS: Lower chest: Evaluation is limited by respiratory motion. Hepatobiliary: No suspicious hepatic lesion on this noncontrast examination.  Gallbladder is unremarkable. No biliary ductal dilation. Pancreas: No pancreatic ductal dilation or evidence of acute inflammation. Spleen: Normal in size without focal abnormality. Adrenals/Urinary Tract: Bilateral adrenal glands appear normal. Mild left hydroureteronephrosis to the level of a 2 mm stone in the distal ureter just proximal to the ureterovesicular junction. Additional punctate nonobstructive bilateral nephrolithiasis. 13 mm bladder calculus. Mild symmetric wall thickening of a nondistended urinary bladder. Stomach/Bowel: No enteric contrast was administered. Small to moderate hiatal hernia. Stomach is unremarkable for degree of distension. No pathologic dilation of small or large bowel. The appendix and terminal ileum appear normal. Small volume of formed stool throughout the colon. No evidence of acute bowel inflammation. Vascular/Lymphatic: Aortic and branch vessel atherosclerosis without abdominal aortic aneurysm. No pathologically enlarged abdominal or pelvic lymph nodes. Reproductive: Prostate is unremarkable. Other: No significant abdominopelvic free fluid. Musculoskeletal: Diffuse demineralization of bone. Multilevel degenerative changes spine with grade 1 degenerative L4 on L5 anterolisthesis. Mild degenerative change of the hips and SI joints. IMPRESSION: 1. Mild left hydroureteronephrosis to the level of a 2 mm stone in the distal left ureter just proximal to the ureterovesicular junction. 2. Additional punctate nonobstructive bilateral nephrolithiasis. 3. 13 mm bladder calculus. 4. Small to moderate hiatal hernia. 5.  Aortic Atherosclerosis (ICD10-I70.0). Electronically Signed   By: Dahlia Bailiff M.D.   On: 11/11/2021 16:37   (Echo, Carotid, EGD, Colonoscopy, ERCP)    Subjective: Pt denies any complaints    Discharge Exam: Vitals:   11/13/21 0434 11/13/21 0756  BP: (!) 162/74 (!) 155/72  Pulse: (!) 59 (!) 57  Resp: 15 18  Temp: 98.3 F (36.8 C) 98.2 F (36.8 C)  SpO2: 94%  97%   Vitals:   11/13/21 0032 11/13/21 0434 11/13/21 0500 11/13/21 0756  BP: (!) 145/64 (!) 162/74  (!) 155/72  Pulse: 62 (!) 59  (!) 57  Resp: 16 15  18   Temp: 98.3 F (36.8 C) 98.3 F (36.8 C)  98.2 F (36.8 C)  TempSrc:  Oral    SpO2: 95% 94%  97%  Weight:   104.4 kg   Height:        General: Pt is alert, awake, not in acute distress Cardiovascular: S1/S2 +, no rubs, no gallops Respiratory: CTA bilaterally, no wheezing, no rhonchi Abdominal: Soft, NT, obese, bowel sounds + Extremities: no edema, no cyanosis    The results of significant diagnostics from this hospitalization (including imaging, microbiology, ancillary and laboratory) are listed below for reference.     Microbiology: Recent Results (from the past 240 hour(s))  Urine Culture  Status: Abnormal   Collection Time: 11/11/21  3:39 PM   Specimen: Urine, Random  Result Value Ref Range Status   Specimen Description   Final    URINE, RANDOM Performed at [facility], [address], [place], [place] [address]    Special Requests   Final    NONE Performed at [facility], [address] [place], [place] [address]    Culture (A)  Final    <10,000 COLONIES/mL INSIGNIFICANT GROWTH Performed at [facility] [address] [place], [place] [address]    Report Status 11/13/2021 FINAL  Final  Resp Panel by RT-PCR (Flu A&B, Covid) Nasopharyngeal Swab     Status: None   Collection Time: 11/11/21  5:18 PM   Specimen: Nasopharyngeal Swab; Nasopharyngeal(NP) swabs in vial transport medium  Result Value Ref Range Status   SARS Coronavirus 2 by RT PCR NEGATIVE NEGATIVE Final    Comment: (NOTE) SARS-CoV-2 target nucleic acids are NOT DETECTED.  The SARS-CoV-2 RNA is generally detectable in upper respiratory specimens during the acute phase of infection. The lowest concentration of SARS-CoV-2 viral copies this assay can detect is 138 copies/mL. A negative result does not preclude  SARS-Cov-2 infection and should not be used as the sole basis for treatment or other patient management decisions. A negative result may occur with  improper specimen collection/handling, submission of specimen other than nasopharyngeal swab, presence of viral mutation(s) within the areas targeted by this assay, and inadequate number of viral copies(<138 copies/mL). A negative result must be combined with clinical observations, patient history, and epidemiological information. The expected result is Negative.  Fact Sheet for Patients:  EntrepreneurPulse.com.au  Fact Sheet for Healthcare Providers:  IncredibleEmployment.be  This test is no t yet approved or cleared by the [place] FDA and  has been authorized for detection and/or diagnosis of SARS-CoV-2 by FDA under an Emergency Use Authorization (EUA). This EUA will remain  in effect (meaning this test can be used) for the duration of the COVID-19 declaration under Section 564(b)(1) of the Act, 21 U.S.C.section 360bbb-3(b)(1), unless the authorization is terminated  or revoked sooner.       Influenza A by PCR NEGATIVE NEGATIVE Final   Influenza B by PCR NEGATIVE NEGATIVE Final    Comment: (NOTE) The Xpert Xpress SARS-CoV-2/FLU/RSV plus assay is intended as an aid in the diagnosis of influenza from Nasopharyngeal swab specimens and should not be used as a sole basis for treatment. Nasal washings and aspirates are unacceptable for Xpert Xpress SARS-CoV-2/FLU/RSV testing.  Fact Sheet for Patients: EntrepreneurPulse.com.au  Fact Sheet for Healthcare Providers: IncredibleEmployment.be  This test is not yet approved or cleared by the [place] FDA and has been authorized for detection and/or diagnosis of SARS-CoV-2 by FDA under an Emergency Use Authorization (EUA). This EUA will remain in effect (meaning this test can be used) for the duration of  the COVID-19 declaration under Section 564(b)(1) of the Act, 21 U.S.C. section 360bbb-3(b)(1), unless the authorization is terminated or revoked.  Performed at [facility], [address] [place], [place] [address]      Labs: BNP (last 3 results) No results for input(s): BNP in the last 8760 hours. Basic Metabolic Panel: Recent Labs  Lab 11/11/21 1539 11/12/21 0504 11/13/21 0427  NA 137 139 140  K 3.6 4.0 3.8  CL 105 106 109  CO2 20* 24 24  GLUCOSE 162* 126* 100*  BUN 13 11 12   CREATININE 1.36* 1.13 1.31*  CALCIUM  9.3 8.7* 8.7*  MG  --  2.0  --   PHOS  --  3.3  --    Liver Function Tests: Recent Labs  Lab 11/11/21 1539 11/12/21 0504  AST 27 23  ALT 17 14  ALKPHOS 45 40  BILITOT 0.7 0.6  PROT 7.4 6.4*  ALBUMIN 4.2 3.6   Recent Labs  Lab 11/11/21 1539  LIPASE 30   No results for input(s): AMMONIA in the last 168 hours. CBC: Recent Labs  Lab 11/11/21 1539 11/12/21 0504 11/13/21 0427  WBC 5.6 6.4 5.9  NEUTROABS  --  4.8  --   HGB 13.0 12.1* 11.5*  HCT 38.9* 36.5* 34.4*  MCV 91.5 93.1 92.0  PLT 198 181 167   Cardiac Enzymes: No results for input(s): CKTOTAL, CKMB, CKMBINDEX, TROPONINI in the last 168 hours. BNP: Invalid input(s): POCBNP CBG: Recent Labs  Lab 11/12/21 0739 11/12/21 1145  GLUCAP 114* 142*   D-Dimer No results for input(s): DDIMER in the last 72 hours. Hgb A1c No results for input(s): HGBA1C in the last 72 hours. Lipid Profile No results for input(s): CHOL, HDL, LDLCALC, TRIG, CHOLHDL, LDLDIRECT in the last 72 hours. Thyroid function studies No results for input(s): TSH, T4TOTAL, T3FREE, THYROIDAB in the last 72 hours.  Invalid input(s): FREET3 Anemia work up No results for input(s): VITAMINB12, FOLATE, FERRITIN, TIBC, IRON, RETICCTPCT in the last 72 hours. Urinalysis    Component Value Date/Time   COLORURINE YELLOW (A) 11/11/2021 1539   APPEARANCEUR CLEAR (A) 11/11/2021 1539   LABSPEC 1.023 11/11/2021 1539    PHURINE 6.0 11/11/2021 1539   GLUCOSEU NEGATIVE 11/11/2021 1539   HGBUR NEGATIVE 11/11/2021 1539   BILIRUBINUR NEGATIVE 11/11/2021 1539   KETONESUR 5 (A) 11/11/2021 1539   PROTEINUR 100 (A) 11/11/2021 1539   NITRITE NEGATIVE 11/11/2021 1539   LEUKOCYTESUR TRACE (A) 11/11/2021 1539   Sepsis Labs Invalid input(s): PROCALCITONIN,  WBC,  LACTICIDVEN Microbiology Recent Results (from the past 240 hour(s))  Urine Culture     Status: Abnormal   Collection Time: 11/11/21  3:39 PM   Specimen: Urine, Random  Result Value Ref Range Status   Specimen Description   Final    URINE, RANDOM Performed at [facility], [address] [place], [place] [address]    Special Requests   Final    NONE Performed at [facility], [address], [place], [place] [address]    Culture (A)  Final    <10,000 COLONIES/mL INSIGNIFICANT GROWTH Performed at [facility], [place] [address], [place], [place] [address]    Report Status 11/13/2021 FINAL  Final  Resp Panel by RT-PCR (Flu A&B, Covid) Nasopharyngeal Swab     Status: None   Collection Time: 11/11/21  5:18 PM   Specimen: Nasopharyngeal Swab; Nasopharyngeal(NP) swabs in vial transport medium  Result Value Ref Range Status   SARS Coronavirus 2 by RT PCR NEGATIVE NEGATIVE Final    Comment: (NOTE) SARS-CoV-2 target nucleic acids are NOT DETECTED.  The SARS-CoV-2 RNA is generally detectable in upper respiratory specimens during the acute phase of infection. The lowest concentration of SARS-CoV-2 viral copies this assay can detect is 138 copies/mL. A negative result does not preclude SARS-Cov-2 infection and should not be used as the sole basis for treatment or other patient management decisions. A negative result may occur with  improper specimen collection/handling, submission of specimen other than nasopharyngeal swab, presence of viral mutation(s) within the areas targeted by this assay, and inadequate number  of  viral copies(<138 copies/mL). A negative result must be combined with clinical observations, patient history, and epidemiological information. The expected result is Negative.  Fact Sheet for Patients:  EntrepreneurPulse.com.au  Fact Sheet for Healthcare Providers:  IncredibleEmployment.be  This test is no t yet approved or cleared by the [place] FDA and  has been authorized for detection and/or diagnosis of SARS-CoV-2 by FDA under an Emergency Use Authorization (EUA). This EUA will remain  in effect (meaning this test can be used) for the duration of the COVID-19 declaration under Section 564(b)(1) of the Act, 21 U.S.C.section 360bbb-3(b)(1), unless the authorization is terminated  or revoked sooner.       Influenza A by PCR NEGATIVE NEGATIVE Final   Influenza B by PCR NEGATIVE NEGATIVE Final    Comment: (NOTE) The Xpert Xpress SARS-CoV-2/FLU/RSV plus assay is intended as an aid in the diagnosis of influenza from Nasopharyngeal swab specimens and should not be used as a sole basis for treatment. Nasal washings and aspirates are unacceptable for Xpert Xpress SARS-CoV-2/FLU/RSV testing.  Fact Sheet for Patients: EntrepreneurPulse.com.au  Fact Sheet for Healthcare Providers: IncredibleEmployment.be  This test is not yet approved or cleared by the [place] FDA and has been authorized for detection and/or diagnosis of SARS-CoV-2 by FDA under an Emergency Use Authorization (EUA). This EUA will remain in effect (meaning this test can be used) for the duration of the COVID-19 declaration under Section 564(b)(1) of the Act, 21 U.S.C. section 360bbb-3(b)(1), unless the authorization is terminated or revoked.  Performed at [facility], [address] [place], [place] [address]      Time coordinating discharge: Over 30 minutes  SIGNED:   Wyvonnia Dusky, MD  [facility] 11/13/2021, 12:14 PM Pager   If 7PM-7AM, please contact night-coverage

## 2021-11-13 NOTE — Plan of Care (Signed)
°  Problem: Education: Goal: Knowledge of General Education information will improve Description: Including pain rating scale, medication(s)/side effects and non-pharmacologic comfort measures 11/13/2021 1244 by Yasmine Kilbourne, Jay Schlichter, RN Outcome: Adequate for Discharge 11/13/2021 1135 by Ayad Nieman, Jay Schlichter, RN Outcome: Progressing   Problem: Health Behavior/Discharge Planning: Goal: Ability to manage health-related needs will improve 11/13/2021 1244 by Jadalyn Oliveri, Jay Schlichter, RN Outcome: Adequate for Discharge 11/13/2021 1135 by Carter Kitten, RN Outcome: Progressing   Problem: Clinical Measurements: Goal: Ability to maintain clinical measurements within normal limits will improve 11/13/2021 1244 by Boston Catarino, Jay Schlichter, RN Outcome: Adequate for Discharge 11/13/2021 1135 by Carter Kitten, RN Outcome: Progressing Goal: Will remain free from infection 11/13/2021 1244 by Onofre Gains, Jay Schlichter, RN Outcome: Adequate for Discharge 11/13/2021 1135 by Carter Kitten, RN Outcome: Progressing Goal: Diagnostic test results will improve 11/13/2021 1244 by Yamel Bale, Jay Schlichter, RN Outcome: Adequate for Discharge 11/13/2021 1135 by Carter Kitten, RN Outcome: Progressing Goal: Respiratory complications will improve 11/13/2021 1244 by Lennin Osmond, Jay Schlichter, RN Outcome: Adequate for Discharge 11/13/2021 1135 by Carter Kitten, RN Outcome: Progressing Goal: Cardiovascular complication will be avoided 11/13/2021 1244 by Kemiya Batdorf, Jay Schlichter, RN Outcome: Adequate for Discharge 11/13/2021 1135 by HuskinsJay Schlichter, RN Outcome: Progressing   Problem: Activity: Goal: Risk for activity intolerance will decrease 11/13/2021 1244 by Jerl Munyan, Jay Schlichter, RN Outcome: Adequate for Discharge 11/13/2021 1135 by Carter Kitten, RN Outcome: Progressing   Problem: Nutrition: Goal: Adequate nutrition will be maintained 11/13/2021 1244 by Mckenlee Mangham, Jay Schlichter, RN Outcome: Adequate for Discharge 11/13/2021 1135 by  Carter Kitten, RN Outcome: Progressing   Problem: Coping: Goal: Level of anxiety will decrease 11/13/2021 1244 by Zyair Rhein, Jay Schlichter, RN Outcome: Adequate for Discharge 11/13/2021 1135 by Carter Kitten, RN Outcome: Progressing   Problem: Elimination: Goal: Will not experience complications related to bowel motility 11/13/2021 1244 by Makaio Mach, Jay Schlichter, RN Outcome: Adequate for Discharge 11/13/2021 1135 by Carter Kitten, RN Outcome: Progressing Goal: Will not experience complications related to urinary retention 11/13/2021 1244 by Ezrah Panning, Jay Schlichter, RN Outcome: Adequate for Discharge 11/13/2021 1135 by Fabiana Dromgoole, Jay Schlichter, RN Outcome: Progressing   Problem: Pain Managment: Goal: General experience of comfort will improve 11/13/2021 1244 by Ronit Marczak, Jay Schlichter, RN Outcome: Adequate for Discharge 11/13/2021 1135 by Carter Kitten, RN Outcome: Progressing   Problem: Safety: Goal: Ability to remain free from injury will improve 11/13/2021 1244 by Malgorzata Albert, Jay Schlichter, RN Outcome: Adequate for Discharge 11/13/2021 1135 by Carter Kitten, RN Outcome: Progressing   Problem: Skin Integrity: Goal: Risk for impaired skin integrity will decrease 11/13/2021 1244 by Meghann Landing, Jay Schlichter, RN Outcome: Adequate for Discharge 11/13/2021 1135 by Carter Kitten, RN Outcome: Progressing

## 2021-11-13 NOTE — Telephone Encounter (Signed)
Patient is currently still admitted

## 2021-11-18 ENCOUNTER — Telehealth: Payer: Self-pay | Admitting: *Deleted

## 2021-11-18 ENCOUNTER — Other Ambulatory Visit: Payer: Self-pay | Admitting: Urology

## 2021-11-18 MED ORDER — FLUCONAZOLE 100 MG PO TABS: 100.0000 mg | ORAL_TABLET | Freq: Every day | ORAL | 0 refills | Status: AC

## 2021-11-18 NOTE — Telephone Encounter (Signed)
-----   Message from Abbie Sons, MD sent at 11/18/2021  2:00 PM EST ----- Hospital urine culture grew a low level of yeast.  I sent in Rx fluconazole to pharmacy.

## 2021-11-18 NOTE — Telephone Encounter (Signed)
Notified patient as instructed,.  

## 2021-11-26 ENCOUNTER — Ambulatory Visit: Payer: Medicare PPO | Admitting: Urology

## 2021-11-26 ENCOUNTER — Encounter: Payer: Self-pay | Admitting: Urology

## 2021-11-26 ENCOUNTER — Other Ambulatory Visit: Payer: Self-pay

## 2021-11-26 VITALS — BP 137/78 | HR 83 | Ht 68.0 in | Wt 208.0 lb

## 2021-11-26 DIAGNOSIS — N200 Calculus of kidney: Secondary | ICD-10-CM

## 2021-11-26 DIAGNOSIS — N210 Calculus in bladder: Secondary | ICD-10-CM

## 2021-11-26 DIAGNOSIS — N201 Calculus of ureter: Secondary | ICD-10-CM

## 2021-11-26 LAB — MICROSCOPIC EXAMINATION: RBC, Urine: >30 /hpf — AB (ref 0–2)

## 2021-11-26 LAB — URINALYSIS, COMPLETE
Bilirubin, UA: NEGATIVE
Glucose, UA: NEGATIVE
Ketones, UA: NEGATIVE
Nitrite, UA: NEGATIVE
Specific Gravity, UA: 1.025 (ref 1.005–1.030)
Urobilinogen, Ur: 0.2 mg/dL (ref 0.2–1.0)
pH, UA: 6 (ref 5.0–7.5)

## 2021-11-27 ENCOUNTER — Telehealth: Payer: Self-pay | Admitting: Urgent Care

## 2021-11-27 NOTE — Progress Notes (Signed)
11/26/2021 12:07 PM   Jeffery Ward 1932-09-23 [id number]  Referring provider: [facility] [address] [place],  [place] [address]  Chief Complaint  Patient presents with   Nephrolithiasis    HPI: 88 y.o. male presents for follow-up of recent hospitalization.  Admitted 11/11/2021 with cute onset of left flank pain associated with nausea, vomiting, diaphoresis and chills.  Lactate was elevated at 2.7 Stone protocol CT with a 3 mm left distal ureteral calculus with hydronephrosis/hydroureter.  He was also found to have a 13 mm bladder calculus and a nonobstructing left renal calculus He underwent urgent stenting with improvement in his symptoms Intraoperative urine culture subsequently grew 1000 colonies of yeast.  Voided urine had insignificant growth Discharge 11/13/2021 on cefdinir He had no postoperative problems.  He does have frequency, urgency and urge incontinence related to the stent No fever or chills. He does have mild dizziness when standing and is on tamsulosin for stent irritation   PMH: Past Medical History:  Diagnosis Date   Balanitis    BPH without obstruction/lower urinary tract symptoms    Coronary artery arteriosclerosis    Erectile dysfunction    Hematuria    Hypogonadism in male    Kidney stones    Microscopic hematuria    Obesity    Urinary urgency     Surgical History: Past Surgical History:  Procedure Laterality Date   CORONARY ARTERY BYPASS GRAFT  2011   CYSTOSCOPY WITH STENT PLACEMENT Left 11/11/2021   Procedure: CYSTOSCOPY WITH STENT PLACEMENT;  Surgeon: Riki Altes, MD;  Location: [facility] ORS;  Service: Urology;  Laterality: Left;   LITHOTRIPSY      Home Medications:  Allergies as of 11/26/2021       Reactions   Amoxicillin Other (See Comments)   Azithromycin Diarrhea   Penicillins Other (See Comments)        Medication List        Accurate as of November 26, 2021 11:59 PM. If you have any questions, ask your nurse or  doctor.          amLODipine 5 MG tablet Commonly known as: NORVASC Take 2.5 mg by mouth every other day.   aspirin 81 MG EC tablet Take 81 mg by mouth daily. Swallow whole.   busPIRone 5 MG tablet Commonly known as: BUSPAR Take 5 mg by mouth daily as needed.   clopidogrel 75 MG tablet Commonly known as: PLAVIX   CoQ10 200 MG Caps Take 1 capsule by mouth daily.   ferrous sulfate 324 MG Tbec Take 1 tablet by mouth at bedtime.   fluticasone 50 MCG/ACT nasal spray Commonly known as: FLONASE   metoprolol succinate 25 MG 24 hr tablet Commonly known as: TOPROL-XL Take 25 mg by mouth daily.   neomycin-polymyxin b-dexamethasone 3.5-10000-0.1 Susp Commonly known as: MAXITROL Reported on 09/10/2015   oxybutynin 5 MG tablet Commonly known as: DITROPAN Take 1 tablet (5 mg total) by mouth every 8 (eight) hours as needed for bladder spasms.   pantoprazole 40 MG tablet Commonly known as: PROTONIX Take by mouth.   potassium citrate 10 MEQ (1080 MG) SR tablet Commonly known as: UROCIT-K Take 10 mEq by mouth daily.   rosuvastatin 20 MG tablet Commonly known as: CRESTOR Take by mouth.   tamsulosin 0.4 MG Caps capsule Commonly known as: FLOMAX Take 1 capsule (0.4 mg total) by mouth daily.   vitamin C 250 MG tablet Commonly known as: ASCORBIC ACID Take 500 mg by mouth daily.  Allergies:  Allergies  Allergen Reactions   Amoxicillin Other (See Comments)   Azithromycin Diarrhea   Penicillins Other (See Comments)    Family History: Family History  Problem Relation Age of Onset   Pneumonia Mother    Pneumonia Father    Kidney disease Neg Hx    Prostate cancer Neg Hx    Bladder Cancer Neg Hx     Social History:  reports that he has never smoked. He does not have any smokeless tobacco history on file. He reports that he does not drink alcohol and does not use drugs.   Physical Exam: BP 137/78    Pulse 83    Ht 5\' 8"  (1.727 m)    Wt 208 lb (94.3 kg)     BMI 31.63 kg/m   Constitutional:  Alert and oriented, No acute distress. HEENT: [place] AT, moist mucus membranes.  Trachea midline, no masses. Cardiovascular: No clubbing, cyanosis, or edema. Respiratory: Normal respiratory effort, no increased work of breathing. GI: Abdomen is soft, nontender, nondistended, no abdominal masses GU: No CVA tenderness Skin: No rashes, bruises or suspicious lesions. Neurologic: Grossly intact, no focal deficits, moving all 4 extremities. Psychiatric: Normal mood and affect.   Assessment & Plan:    1.  Left distal ureteral calculus Small distal calculus status post stenting Based on stone size and location it is possible he may have passed.  I did recommend scheduling cystoscopy with stent removal and follow-up left ureteroscopy with stone removal if calculus still present The procedure was discussed in detail including potential risks of bleeding, infection/sepsis and rarely ureteral injury.  The possible need for replacement of a stent for a short time was discussed Urine culture repeated today.  2.  Bladder calculus Cystolitholapaxy at the time of ureteroscopy He has no bothersome lower urinary tract symptoms prior to hospitalization.  He declined an outlet procedure at this time.  ***Surgery orders;?  Diflucan  , MD  [facility] [address], [address] [place], [place] [phone]

## 2021-11-27 NOTE — H&P (View-Only) (Signed)
? ?11/26/2021 ?12:07 PM  ? ?Jeffery Ward ?Feb 11, 1933 ?[id number] ? ?Referring provider: [facility] ?[address] ?[place],  [place] [address] ? ?Chief Complaint  ?Patient presents with  ? Nephrolithiasis  ? ? ?HPI: ?88 y.o. male presents for follow-up of recent hospitalization. ? ?Admitted 11/11/2021 with cute onset of left flank pain associated with nausea, vomiting, diaphoresis and chills.  Lactate was elevated at 2.7 ?Stone protocol CT with a 3 mm left distal ureteral calculus with hydronephrosis/hydroureter.  He was also found to have a 13 mm bladder calculus and a nonobstructing left renal calculus ?He underwent urgent stenting with improvement in his symptoms ?Intraoperative urine culture subsequently grew 1000 colonies of yeast.  Voided urine had insignificant growth ?Discharge 11/13/2021 on cefdinir ?He had no postoperative problems.  He does have frequency, urgency and urge incontinence related to the stent ?No fever or chills. ?He does have mild dizziness when standing and is on tamsulosin for stent irritation ? ? ?PMH: ?Past Medical History:  ?Diagnosis Date  ? Balanitis   ? BPH without obstruction/lower urinary tract symptoms   ? Coronary artery arteriosclerosis   ? Erectile dysfunction   ? Hematuria   ? Hypogonadism in male   ? Kidney stones   ? Microscopic hematuria   ? Obesity   ? Urinary urgency   ? ? ?Surgical History: ?Past Surgical History:  ?Procedure Laterality Date  ? CORONARY ARTERY BYPASS GRAFT  2011  ? CYSTOSCOPY WITH STENT PLACEMENT Left 11/11/2021  ? Procedure: CYSTOSCOPY WITH STENT PLACEMENT;  Surgeon: Riki Altes, MD;  Location: [facility] ORS;  Service: Urology;  Laterality: Left;  ? LITHOTRIPSY    ? ? ?Home Medications:  ?Allergies as of 11/26/2021   ? ?   Reactions  ? Amoxicillin Other (See Comments)  ? Azithromycin Diarrhea  ? Penicillins Other (See Comments)  ? ?  ? ?  ?Medication List  ?  ? ?  ? Accurate as of November 26, 2021 11:59 PM. If you have any questions, ask your nurse or  doctor.  ?  ?  ? ?  ? ?amLODipine 5 MG tablet ?Commonly known as: NORVASC ?Take 2.5 mg by mouth every other day. ?  ?aspirin 81 MG EC tablet ?Take 81 mg by mouth daily. Swallow whole. ?  ?busPIRone 5 MG tablet ?Commonly known as: BUSPAR ?Take 5 mg by mouth daily as needed. ?  ?clopidogrel 75 MG tablet ?Commonly known as: PLAVIX ?  ?CoQ10 200 MG Caps ?Take 1 capsule by mouth daily. ?  ?ferrous sulfate 324 MG Tbec ?Take 1 tablet by mouth at bedtime. ?  ?fluticasone 50 MCG/ACT nasal spray ?Commonly known as: FLONASE ?  ?metoprolol succinate 25 MG 24 hr tablet ?Commonly known as: TOPROL-XL ?Take 25 mg by mouth daily. ?  ?neomycin-polymyxin b-dexamethasone 3.5-10000-0.1 Susp ?Commonly known as: MAXITROL ?Reported on 09/10/2015 ?  ?oxybutynin 5 MG tablet ?Commonly known as: DITROPAN ?Take 1 tablet (5 mg total) by mouth every 8 (eight) hours as needed for bladder spasms. ?  ?pantoprazole 40 MG tablet ?Commonly known as: PROTONIX ?Take by mouth. ?  ?potassium citrate 10 MEQ (1080 MG) SR tablet ?Commonly known as: UROCIT-K ?Take 10 mEq by mouth daily. ?  ?rosuvastatin 20 MG tablet ?Commonly known as: CRESTOR ?Take by mouth. ?  ?tamsulosin 0.4 MG Caps capsule ?Commonly known as: FLOMAX ?Take 1 capsule (0.4 mg total) by mouth daily. ?  ?vitamin C 250 MG tablet ?Commonly known as: ASCORBIC ACID ?Take 500 mg by mouth daily. ?  ? ?  ? ? ?  Allergies:  ?Allergies  ?Allergen Reactions  ? Amoxicillin Other (See Comments)  ? Azithromycin Diarrhea  ? Penicillins Other (See Comments)  ? ? ?Family History: ?Family History  ?Problem Relation Age of Onset  ? Pneumonia Mother   ? Pneumonia Father   ? Kidney disease Neg Hx   ? Prostate cancer Neg Hx   ? Bladder Cancer Neg Hx   ? ? ?Social History:  reports that he has never smoked. He does not have any smokeless tobacco history on file. He reports that he does not drink alcohol and does not use drugs. ? ? ?Physical Exam: ?BP 137/78   Pulse 83   Ht 5\' 8"  (1.727 m)   Wt 208 lb (94.3 kg)    BMI 31.63 kg/m?   ?Constitutional:  Alert and oriented, No acute distress. ?HEENT: [place] AT, moist mucus membranes.  Trachea midline, no masses. ?Cardiovascular: No clubbing, cyanosis, or edema. ?Respiratory: Normal respiratory effort, no increased work of breathing. ?GI: Abdomen is soft, nontender, nondistended, no abdominal masses ?GU: No CVA tenderness ?Skin: No rashes, bruises or suspicious lesions. ?Neurologic: Grossly intact, no focal deficits, moving all 4 extremities. ?Psychiatric: Normal mood and affect. ? ? ?Assessment & Plan:   ? ?1.  Left distal ureteral calculus ?Small distal calculus status post stenting ?Based on stone size and location it is possible he may have passed.  I did recommend scheduling cystoscopy with stent removal and follow-up left ureteroscopy with stone removal if calculus still present ?The procedure was discussed in detail including potential risks of bleeding, infection/sepsis and rarely ureteral injury.  The possible need for replacement of a stent for a short time was discussed ?Urine culture repeated today. ? ?2.  Bladder calculus ?Cystolitholapaxy at the time of ureteroscopy ?He has no bothersome lower urinary tract symptoms prior to hospitalization.  He declined an outlet procedure at this time. ? ? ? , MD ? ?[facility] ?[address], [address] ?[place], [place] ?[phone] ? ?

## 2021-11-27 NOTE — Progress Notes (Signed)
?  Perioperative Services ?Pre-Admission/Anesthesia Testing ? ?  ? ?Date: 11/27/21 ? ?Name: Jeffery Ward ?MRN:   [id number] ? ?Re: Request from surgery for clearance prior to scheduled procedure ? ?Patient is scheduled to undergo a CYSTOLITHOLAPAXY; URETEROSCOPY WITH LASER LITHOTRIPSY AND STENT PLACEMENT on 12/09/2021 with Dr. John Giovanni, MD. Patient has not been scheduled for his PAT appointment at this point, thus has not undergone review by PAT RN and/or APP. Received communication from primary attending surgeon's office requesting that patient be submitted for clearance from cardiology.  ? ?PROVIDER SPECIALTY FAXED TO  ? Neoma Laming, MD  Cardiology  [phone]  ? ?Plan:  ?Clearance documents generated and faxed to appropriate provider(s) as noted above. Note will be updated to reflect communication with provider's office as it relates to clearance being provided and/or the need for office visit prior to clearance for surgery being issued.  ? ?Honor Loh, MSN, APRN, FNP-C, CEN ?[place]  ?Peri-operative Services Nurse Practitioner ?Phone: [phone] ?11/27/21 2:58 PM ? ?NOTE: This note has been prepared using [employer]. Despite my best ability to proofread, there is always the potential that unintentional transcriptional errors may still occur from this process. ? ?

## 2021-11-28 ENCOUNTER — Telehealth: Payer: Self-pay

## 2021-11-28 NOTE — Addendum Note (Signed)
Addended by: Letta Kocher A on: 11/28/2021 12:16 PM   Modules accepted: Orders

## 2021-11-28 NOTE — Progress Notes (Signed)
[facility] Posting Form   Surgery Date/Time: Date: 12/09/2021  Surgeon: Dr. Irineo Axon, MD  Surgery Location: Day Surgery  Inpt ( No  )   Outpt (Yes)   Obs ( No  )   Diagnosis: N21.0 Bladder Stone, N20.1 Left Ureteral Stone  -CPT: 21308, [phone]  Surgery: Left Cystolitholapaxy and Left Ureteroscopy with laser lithotripsy/stone removal and stent exchange  Stop Anticoagulations: Yes  Cardiac/Medical/Pulmonary Clearance needed: Yes  Clearance needed from Dr: Welton Flakes  Clearance request sent on: Date: 11/27/2021- Sent by Quentin Mulling, NP with Peri-Operative Services   *Orders entered into EPIC  Date: 11/28/21   *Case booked in EPIC  Date: 11/27/2021  *Notified pt of Surgery: Date: 11/27/2021  PRE-OP UA & CX: Yes, obtained on 11/26/2021  *Placed into Prior Authorization Work Angela Nevin Date: 11/28/21   Assistant/laser/rep:No

## 2021-11-28 NOTE — Telephone Encounter (Signed)
I spoke with Jeffery Ward and His wife with granddaughter present. We have discussed possible surgery dates and Tuesday March 21st, 2023 was agreed upon by all parties. Patient given information about surgery date, what to expect pre-operatively and post operatively.  ? ?We discussed that a Pre-Admission Testing office will be calling to set up the pre-op visit that will take place prior to surgery, and that these appointments are typically done over the phone with a Pre-Admissions RN.  ? ?Informed patient that our office will communicate any additional care to be provided after surgery. Patients questions or concerns were discussed during our call. Advised to call our office should there be any additional information, questions or concerns that arise. Patient verbalized understanding.  ? ? ? ? ? ? ? ? ? ? ? ? ? ? ? ? ?

## 2021-11-29 LAB — CULTURE, URINE COMPREHENSIVE

## 2021-12-02 ENCOUNTER — Telehealth: Payer: Self-pay | Admitting: Urgent Care

## 2021-12-02 ENCOUNTER — Telehealth: Payer: Self-pay

## 2021-12-02 MED ORDER — FLUCONAZOLE 100 MG PO TABS: 100.0000 mg | ORAL_TABLET | Freq: Every day | ORAL | 0 refills | Status: DC

## 2021-12-02 NOTE — Telephone Encounter (Signed)
Spoke with patients wife. I have sent medication to [facility] per request. Verbalized understanding.  ?

## 2021-12-02 NOTE — Progress Notes (Signed)
?  Perioperative Services ?Pre-Admission/Anesthesia Testing ? ?  ? ?Date: 12/02/21 ? ?Name: Jeffery Ward ?MRN:   [id number] ? ?Re: Surgical clearance ? ?Surgical clearance received from Dr. Christy Sartorius office (cardiology). Patient has been cleared for the planned CYSTOSCOPY WITH LITHOLAPAXY; CYSTOSCOPY/URETEROSCOPY/HOLMIUM LASER/STENT EXCHANGE procedure scheduled for 12/09/2021 with Dr. Irineo Axon, MD.  Cardiology notes that patient may proceed with an overall MODERATE risk stratification. Patient cleared to hold daily DAPT medications (ASA + clopidogrel) for 7 days prior to his procedure with plans to restart as soon as postoperative bleeding risk felt to be minimized by his primary attending surgeon. Copy of signed clearance form placed on patient's OR chart for review by the surgical/anesthetic team on the day of his procedure.  ? ?Quentin Mulling, MSN, APRN, FNP-C, CEN ?[facility]  ?Peri-operative Services Nurse Practitioner ?Phone: [phone] ?12/02/21 5:10 PM ? ?

## 2021-12-02 NOTE — Telephone Encounter (Signed)
-----   Message from Abbie Sons, MD sent at 11/29/2021 12:34 PM EST ----- ?Urine culture was negative however intraoperative culture at the time of stent placement grew a low level of yeast.  Please send in Rx fluconazole 100 mg daily x5 days.  Have him start 1 week prior to his surgery date.  Gentamicin preop dosing per pharmacy.  Does not look like Shannon ordered preop antibiotic but if she did let gentamicin supersede  ?

## 2021-12-03 ENCOUNTER — Other Ambulatory Visit: Payer: Self-pay

## 2021-12-03 ENCOUNTER — Encounter
Admission: RE | Admit: 2021-12-03 | Discharge: 2021-12-03 | Disposition: A | Discharge status: Home / Self Care | Payer: Medicare PPO | Source: Ambulatory Visit | Attending: Urology | Admitting: Urology

## 2021-12-03 ENCOUNTER — Encounter: Payer: Self-pay | Admitting: Urology

## 2021-12-03 HISTORY — DX: Gastro-esophageal reflux disease without esophagitis: K21.9

## 2021-12-03 HISTORY — DX: Anxiety disorder, unspecified: F41.9

## 2021-12-03 HISTORY — DX: Unspecified hearing loss, unspecified ear: H91.90

## 2021-12-03 HISTORY — DX: Unspecified osteoarthritis, unspecified site: M19.90

## 2021-12-03 NOTE — Patient Instructions (Addendum)
Your procedure is scheduled on: Tuesday 12/09/21 ?Report to the Registration Desk on the 1st floor of the [facility]. ?To find out your arrival time, please call [phone] between 1PM - 3PM on: Monday 12/08/21 ? ?REMEMBER: ?Instructions that are not followed completely may result in serious medical risk, up to and including death; or upon the discretion of your surgeon and anesthesiologist your surgery may need to be rescheduled. ? ?Do not eat or drink after midnight the night before surgery.  ?No gum chewing, lozengers or hard candies. ? ? ?TAKE THESE MEDICATIONS THE MORNING OF SURGERY WITH A SIP OF WATER: ?busPIRone (BUSPAR) 5 MG tablet (if needed) ? ?pantoprazole (PROTONIX) 40 MG tablet (take one the night before and one on the morning of surgery - helps to prevent nausea after surgery.) ? ?Hold aspirin 7 days prior to procedure and resume 1 day after.procedure. ?Hold Plavix 7 days prior to procedure and resume 1 day after procedure ? ?One week prior to surgery: ?Stop Anti-inflammatories (NSAIDS) such as Advil, Aleve, Ibuprofen, Motrin, Naproxen, Naprosyn and Aspirin based products such as Excedrin, Goodys Powder, BC Powder. ?Stop taking your Coenzyme Q10 (COQ10) 200 MG CAPS, vitamin C (ASCORBIC ACID) 250 MG tablet, and ANY other OVER THE COUNTER supplements until after surgery. ?You may however, continue to take Tylenol if needed for pain up until the day of surgery. ? ?No Alcohol for 24 hours before or after surgery. ? ?No Smoking including e-cigarettes for 24 hours prior to surgery.  ?No chewable tobacco products for at least 6 hours prior to surgery.  ?No nicotine patches on the day of surgery. ? ?Do not use any "recreational" drugs for at least a week prior to your surgery.  ?Please be advised that the combination of cocaine and anesthesia may have negative outcomes, up to and including death. ?If you test positive for cocaine, your surgery will be cancelled. ? ?On the morning of surgery brush your  teeth with toothpaste and water, you may rinse your mouth with mouthwash if you wish. ?Do not swallow any toothpaste or mouthwash. ? ?Do not wear jewelry. ? ?Do not wear lotions, powders, or colognes.  ? ?Do not shave body from the neck down 48 hours prior to surgery just in case you cut yourself which could leave a site for infection.  ? ?Do not bring valuables to the hospital. [facility] is not responsible for any missing/lost belongings or valuables.  ? ?Notify your doctor if there is any change in your medical condition (cold, fever, infection). ? ?Wear comfortable clothing (specific to your surgery type) to the hospital. ? ?If you are being discharged the day of surgery, you will not be allowed to drive home. ?You will need a responsible adult (18 years or older) to drive you home and stay with you that night.  ? ?If you are taking public transportation, you will need to have a responsible adult (18 years or older) with you. ?Please confirm with your physician that it is acceptable to use public transportation.  ? ?Please call the [facility] at [phone] if you have any questions about these instructions. ? ?Surgery Visitation Policy: ? ?Patients undergoing a surgery or procedure may have one family member or support person with them as long as that person is not COVID-19 positive or experiencing its symptoms.  ?That person may remain in the waiting area during the procedure and may rotate out with other people. ? ?Inpatient Visitation:   ? ?Visiting hours are  7 a.m. to 8 p.m. ?Up to two visitors ages 16+ are allowed at one time in a patient room. The visitors may rotate out with other people during the day. Visitors must check out when they leave, or other visitors will not be allowed. One designated support person may remain overnight. ?The visitor must pass COVID-19 screenings, use hand sanitizer when entering and exiting the patient?s room and wear a mask at all times, including in  the patient?s room. ?Patients must also wear a mask when staff or their visitor are in the room. ?Masking is required regardless of vaccination status.  ?

## 2021-12-09 ENCOUNTER — Ambulatory Visit: Payer: Medicare PPO | Admitting: Certified Registered"

## 2021-12-09 ENCOUNTER — Ambulatory Visit: Payer: Medicare PPO

## 2021-12-09 ENCOUNTER — Encounter: Payer: Self-pay | Admitting: Urology

## 2021-12-09 ENCOUNTER — Ambulatory Visit
Admission: RE | Admit: 2021-12-09 | Discharge: 2021-12-09 | Disposition: A | Discharge status: Home / Self Care | Payer: Medicare PPO | Source: Ambulatory Visit | Attending: Urology | Admitting: Urology

## 2021-12-09 ENCOUNTER — Encounter
Admission: RE | Disposition: A | Discharge status: Home / Self Care | Payer: Self-pay | Source: Ambulatory Visit | Attending: Urology

## 2021-12-09 DIAGNOSIS — K219 Gastro-esophageal reflux disease without esophagitis: Secondary | ICD-10-CM | POA: Diagnosis not present

## 2021-12-09 DIAGNOSIS — N200 Calculus of kidney: Secondary | ICD-10-CM

## 2021-12-09 DIAGNOSIS — N201 Calculus of ureter: Secondary | ICD-10-CM

## 2021-12-09 DIAGNOSIS — R42 Dizziness and giddiness: Secondary | ICD-10-CM | POA: Insufficient documentation

## 2021-12-09 DIAGNOSIS — N202 Calculus of kidney with calculus of ureter: Secondary | ICD-10-CM | POA: Diagnosis present

## 2021-12-09 DIAGNOSIS — Z79899 Other long term (current) drug therapy: Secondary | ICD-10-CM | POA: Insufficient documentation

## 2021-12-09 DIAGNOSIS — M1990 Unspecified osteoarthritis, unspecified site: Secondary | ICD-10-CM | POA: Insufficient documentation

## 2021-12-09 DIAGNOSIS — I2510 Atherosclerotic heart disease of native coronary artery without angina pectoris: Secondary | ICD-10-CM | POA: Insufficient documentation

## 2021-12-09 DIAGNOSIS — Z951 Presence of aortocoronary bypass graft: Secondary | ICD-10-CM | POA: Insufficient documentation

## 2021-12-09 DIAGNOSIS — N132 Hydronephrosis with renal and ureteral calculous obstruction: Secondary | ICD-10-CM | POA: Insufficient documentation

## 2021-12-09 DIAGNOSIS — N210 Calculus in bladder: Secondary | ICD-10-CM

## 2021-12-09 HISTORY — PX: CYSTOSCOPY WITH LITHOLAPAXY: SHX1425

## 2021-12-09 HISTORY — PX: CYSTOSCOPY/RETROGRADE/URETEROSCOPY/STONE EXTRACTION WITH BASKET: SHX5317

## 2021-12-09 HISTORY — PX: CYSTOSCOPY WITH HOLMIUM LASER LITHOTRIPSY: SHX6639

## 2021-12-09 SURGERY — CYSTOSCOPY, WITH BLADDER CALCULUS LITHOLAPAXY
Anesthesia: General

## 2021-12-09 MED ORDER — CHLORHEXIDINE GLUCONATE 0.12 % MT SOLN
15.0000 mL | Freq: Once | OROMUCOSAL | Status: AC
  Administered 2021-12-09: 15 mL via OROMUCOSAL

## 2021-12-09 MED ORDER — PROPOFOL 10 MG/ML IV BOLUS
INTRAVENOUS | Status: DC | PRN
  Administered 2021-12-09: 150 mg via INTRAVENOUS

## 2021-12-09 MED ORDER — FENTANYL CITRATE (PF) 100 MCG/2ML IJ SOLN: 25.0000 ug | INTRAMUSCULAR | Status: DC | PRN

## 2021-12-09 MED ORDER — ACETAMINOPHEN 10 MG/ML IV SOLN
INTRAVENOUS | Status: DC | PRN
  Administered 2021-12-09: 1000 mg via INTRAVENOUS

## 2021-12-09 MED ORDER — ROCURONIUM BROMIDE 100 MG/10ML IV SOLN
INTRAVENOUS | Status: DC | PRN
Start: 2021-12-09 — End: 2021-12-09
  Administered 2021-12-09: 50 mg via INTRAVENOUS

## 2021-12-09 MED ORDER — FENTANYL CITRATE (PF) 100 MCG/2ML IJ SOLN
INTRAMUSCULAR | Status: AC
  Filled 2021-12-09: qty 2

## 2021-12-09 MED ORDER — PROPOFOL 10 MG/ML IV BOLUS
INTRAVENOUS | Status: AC
  Filled 2021-12-09: qty 20

## 2021-12-09 MED ORDER — OXYCODONE HCL 5 MG PO TABS: 5.0000 mg | ORAL_TABLET | Freq: Once | ORAL | Status: DC | PRN

## 2021-12-09 MED ORDER — IOHEXOL 180 MG/ML  SOLN
INTRAMUSCULAR | Status: DC | PRN
  Administered 2021-12-09: 20 mL

## 2021-12-09 MED ORDER — SUGAMMADEX SODIUM 200 MG/2ML IV SOLN
INTRAVENOUS | Status: DC | PRN
Start: 2021-12-09 — End: 2021-12-09
  Administered 2021-12-09: 200 mg via INTRAVENOUS

## 2021-12-09 MED ORDER — PHENYLEPHRINE HCL (PRESSORS) 10 MG/ML IV SOLN
INTRAVENOUS | Status: AC
  Filled 2021-12-09: qty 1

## 2021-12-09 MED ORDER — LACTATED RINGERS IV SOLN: INTRAVENOUS | Status: DC

## 2021-12-09 MED ORDER — ACETAMINOPHEN 10 MG/ML IV SOLN
INTRAVENOUS | Status: AC
  Filled 2021-12-09: qty 100

## 2021-12-09 MED ORDER — GENTAMICIN SULFATE 40 MG/ML IJ SOLN
5.0000 mg/kg | Freq: Once | INTRAVENOUS | Status: AC
  Administered 2021-12-09: 470 mg via INTRAVENOUS
  Filled 2021-12-09: qty 11.75

## 2021-12-09 MED ORDER — SODIUM CHLORIDE 0.9 % IR SOLN
Status: DC | PRN
  Administered 2021-12-09: 3000 mL

## 2021-12-09 MED ORDER — CHLORHEXIDINE GLUCONATE 0.12 % MT SOLN
OROMUCOSAL | Status: AC
  Filled 2021-12-09: qty 15

## 2021-12-09 MED ORDER — ORAL CARE MOUTH RINSE: 15.0000 mL | Freq: Once | OROMUCOSAL | Status: AC

## 2021-12-09 MED ORDER — EPHEDRINE SULFATE (PRESSORS) 50 MG/ML IJ SOLN
INTRAMUSCULAR | Status: DC | PRN
  Administered 2021-12-09 (×2): 10 mg via INTRAVENOUS

## 2021-12-09 MED ORDER — PHENYLEPHRINE HCL-NACL 20-0.9 MG/250ML-% IV SOLN
INTRAVENOUS | Status: AC
  Filled 2021-12-09: qty 250

## 2021-12-09 MED ORDER — DEXAMETHASONE SODIUM PHOSPHATE 10 MG/ML IJ SOLN
INTRAMUSCULAR | Status: DC | PRN
  Administered 2021-12-09: 10 mg via INTRAVENOUS

## 2021-12-09 MED ORDER — LIDOCAINE HCL (CARDIAC) PF 100 MG/5ML IV SOSY
PREFILLED_SYRINGE | INTRAVENOUS | Status: DC | PRN
  Administered 2021-12-09: 50 mg via INTRAVENOUS

## 2021-12-09 MED ORDER — OXYCODONE HCL 5 MG/5ML PO SOLN: 5.0000 mg | Freq: Once | ORAL | Status: DC | PRN

## 2021-12-09 SURGICAL SUPPLY — 41 items
6F open ended catheter ×2 IMPLANT
BAG DRAIN CYSTO-URO LG1000N (MISCELLANEOUS) ×4 IMPLANT
BAG URINE DRAIN 2000ML AR STRL (UROLOGICAL SUPPLIES) IMPLANT
BASKET LASER NITINOL 1.9FR (BASKET) ×2 IMPLANT
BASKET ZERO TIP 1.9FR (BASKET) IMPLANT
BRUSH SCRUB EZ 1% IODOPHOR (MISCELLANEOUS) ×4 IMPLANT
CATH FOL 2WAY LX 18X30 (CATHETERS) IMPLANT
CATH URET FLEX-TIP 2 LUMEN 10F (CATHETERS) IMPLANT
CATH URETL OPEN END 6X70 (CATHETERS) ×2 IMPLANT
CNTNR SPEC 2.5X3XGRAD LEK (MISCELLANEOUS)
CONT SPEC 4OZ STER OR WHT (MISCELLANEOUS)
CONTAINER SPEC 2.5X3XGRAD LEK (MISCELLANEOUS) IMPLANT
DRAPE UTILITY 15X26 TOWEL STRL (DRAPES) ×4 IMPLANT
FIBER LASER FLEXIVA PULSE 910 (Laser) IMPLANT
FIBER LASER MOSES 365 DFL (Laser) ×2 IMPLANT
GAUZE 4X4 16PLY ~~LOC~~+RFID DBL (SPONGE) ×6 IMPLANT
GLOVE SURG UNDER POLY LF SZ7.5 (GLOVE) ×4 IMPLANT
GOWN STRL REUS W/ TWL LRG LVL3 (GOWN DISPOSABLE) ×6 IMPLANT
GOWN STRL REUS W/ TWL XL LVL3 (GOWN DISPOSABLE) ×3 IMPLANT
GOWN STRL REUS W/TWL LRG LVL3 (GOWN DISPOSABLE) ×2
GOWN STRL REUS W/TWL XL LVL3 (GOWN DISPOSABLE) ×1
GUIDEWIRE STR DUAL SENSOR (WIRE) ×6 IMPLANT
INFUSOR MANOMETER BAG 3000ML (MISCELLANEOUS) ×2 IMPLANT
IV NS IRRIG 3000ML ARTHROMATIC (IV SOLUTION) ×8 IMPLANT
KIT PROBE TRILOGY 3.9X350 (MISCELLANEOUS) IMPLANT
KIT TURNOVER CYSTO (KITS) ×4 IMPLANT
MANIFOLD NEPTUNE II (INSTRUMENTS) ×2 IMPLANT
PACK CYSTO AR (MISCELLANEOUS) ×4 IMPLANT
SET CYSTO W/LG BORE CLAMP LF (SET/KITS/TRAYS/PACK) ×4 IMPLANT
SET IRRIG Y TYPE TUR BLADDER L (SET/KITS/TRAYS/PACK) ×4 IMPLANT
SHEATH URETERAL 12FRX35CM (MISCELLANEOUS) IMPLANT
STENT URET 6FRX24 CONTOUR (STENTS) IMPLANT
STENT URET 6FRX26 CONTOUR (STENTS) IMPLANT
SURGILUBE 2OZ TUBE FLIPTOP (MISCELLANEOUS) ×4 IMPLANT
SYR TOOMEY IRRIG 70ML (MISCELLANEOUS) ×4
SYRINGE TOOMEY IRRIG 70ML (MISCELLANEOUS) ×3 IMPLANT
TRACTIP FLEXIVA PULSE ID 200 (Laser) ×2 IMPLANT
VALVE UROSEAL ADJ ENDO (VALVE) ×2 IMPLANT
WATER STERILE IRR 1000ML POUR (IV SOLUTION) ×4 IMPLANT
WATER STERILE IRR 3000ML UROMA (IV SOLUTION) IMPLANT
WATER STERILE IRR 500ML POUR (IV SOLUTION) ×4 IMPLANT

## 2021-12-09 NOTE — Anesthesia Postprocedure Evaluation (Signed)
Anesthesia Post Note ? ?Patient: KREIG PARSON ? ?Procedure(s) Performed: CYSTOSCOPY WITH LITHOLAPAXY ?CYSTOSCOPY/RETROGRADE/URETEROSCOPY/STONE EXTRACTION WITH BASKET ?CYSTOSCOPY WITH HOLMIUM LASER LITHOTRIPSY ? ?Patient location during evaluation: PACU ?Anesthesia Type: General ?Level of consciousness: awake and alert ?Pain management: pain level controlled ?Vital Signs Assessment: post-procedure vital signs reviewed and stable ?Respiratory status: spontaneous breathing, nonlabored ventilation, respiratory function stable and patient connected to nasal cannula oxygen ?Cardiovascular status: blood pressure returned to baseline and stable ?Postop Assessment: no apparent nausea or vomiting ?Anesthetic complications: no ? ? ?No notable events documented. ? ? ?Last Vitals:  ?Vitals:  ? 12/09/21 0940 12/09/21 1006  ?BP: 133/72 134/67  ?Pulse: 70 69  ?Resp: 16 16  ?Temp: (!) 36.1 ?C (!) 36.2 ?C  ?SpO2: 98% 96%  ?  ?Last Pain:  ?Vitals:  ? 12/09/21 1006  ?TempSrc: Temporal  ?PainSc: 4   ? ? ?  ?  ?  ?  ?  ?  ? ?Cleda Mccreedy Jakorey Mcconathy ? ? ? ? ?

## 2021-12-09 NOTE — Interval H&P Note (Signed)
History and Physical Interval Note: ? ?12/09/2021 ?7:18 AM ? ?Jeffery Ward  has presented today for surgery, with the diagnosis of Bladder Stone, Left Ureteral Stone.  The various methods of treatment have been discussed with the patient and family. After consideration of risks, benefits and other options for treatment, the patient has consented to  Procedure(s): ?CYSTOSCOPY WITH LITHOLAPAXY (N/A) ?CYSTOSCOPY/URETEROSCOPY/HOLMIUM LASER/STENT EXCHANGE (Left) as a surgical intervention.  The patient's history has been reviewed, patient examined, no change in status, stable for surgery.  I have reviewed the patient's chart and labs.  Questions were answered to the patient's satisfaction.   ? ? ?Kameko Hukill C Marcille Barman ? ? ?

## 2021-12-09 NOTE — Progress Notes (Signed)
Removed foley per order, urine color is clear/ pink. 200 cc in bag. Patient tolerated removal well. ?

## 2021-12-09 NOTE — Anesthesia Procedure Notes (Signed)
Procedure Name: Intubation ?Date/Time: 12/09/2021 7:35 AM ?Performed by: Biagio Borg, CRNA ?Pre-anesthesia Checklist: Patient identified, Emergency Drugs available, Suction available and Patient being monitored ?Patient Re-evaluated:Patient Re-evaluated prior to induction ?Oxygen Delivery Method: Circle system utilized ?Preoxygenation: Pre-oxygenation with 100% oxygen ?Induction Type: IV induction ?Ventilation: Mask ventilation without difficulty ?Laryngoscope Size: McGraph and 4 ?Grade View: Grade I ?Tube type: Oral ?Number of attempts: 1 ?Airway Equipment and Method: Stylet ?Placement Confirmation: ETT inserted through vocal cords under direct vision, positive ETCO2 and breath sounds checked- equal and bilateral ?Secured at: 21 cm ?Tube secured with: Tape ?Dental Injury: Teeth and Oropharynx as per pre-operative assessment  ? ? ? ? ?

## 2021-12-09 NOTE — Anesthesia Preprocedure Evaluation (Signed)
Anesthesia Evaluation  ?Patient identified by MRN, date of birth, ID band ?Patient awake ? ? ? ?Reviewed: ?Allergy & Precautions, NPO status , Patient's Chart, lab work & pertinent test results ? ?History of Anesthesia Complications ?(+) DIFFICULT AIRWAY and history of anesthetic complications ? ?Airway ?Mallampati: III ? ?TM Distance: <3 FB ?Neck ROM: full ? ? ? Dental ? ?(+) Chipped, Poor Dentition, Missing ?  ?Pulmonary ?neg pulmonary ROS, neg shortness of breath,  ?  ?Pulmonary exam normal ? ? ? ? ? ? ? Cardiovascular ?Exercise Tolerance: Good ?(-) angina+ CAD and + CABG  ?(-) DOE Normal cardiovascular exam ? ? ?  ?Neuro/Psych ?negative neurological ROS ? negative psych ROS  ? GI/Hepatic ?Neg liver ROS, GERD  Controlled,  ?Endo/Other  ?negative endocrine ROS ? Renal/GU ?Renal disease  ? ?  ?Musculoskeletal ? ? Abdominal ?  ?Peds ? Hematology ?negative hematology ROS ?(+)   ?Anesthesia Other Findings ?Past Medical History: ?No date: Anxiety ?No date: Arthritis ?    Comment:  back ?No date: Balanitis ?No date: BPH without obstruction/lower urinary tract symptoms ?No date: Coronary artery arteriosclerosis ?No date: Erectile dysfunction ?No date: GERD (gastroesophageal reflux disease) ?No date: Hearing difficulty ?No date: Hematuria ?No date: Hypogonadism in male ?No date: Kidney stones ?No date: Microscopic hematuria ?No date: Obesity ?No date: Urinary urgency ? ?Past Surgical History: ?2011: CORONARY ARTERY BYPASS GRAFT ?11/11/2021: CYSTOSCOPY WITH STENT PLACEMENT; Left ?    Comment:  Procedure: CYSTOSCOPY WITH STENT PLACEMENT;  Surgeon:  ?             Riki Altes, MD;  Location: [facility] ORS;  Service:  ?             Urology;  Laterality: Left; ?No date: LITHOTRIPSY ? ?BMI   ? Body Mass Index: 30.41 kg/m?  ?  ? ? Reproductive/Obstetrics ?negative OB ROS ? ?  ? ? ? ? ? ? ? ? ? ? ? ? ? ?  ?  ? ? ? ? ? ? ? ? ?Anesthesia Physical ?Anesthesia Plan ? ?ASA: 3 ? ?Anesthesia Plan: General  ETT  ? ?Post-op Pain Management:   ? ?Induction: Intravenous ? ?PONV Risk Score and Plan: Ondansetron, Dexamethasone, Midazolam and Treatment may vary due to age or medical condition ? ?Airway Management Planned: Oral ETT and Video Laryngoscope Planned ? ?Additional Equipment:  ? ?Intra-op Plan:  ? ?Post-operative Plan: Extubation in OR ? ?Informed Consent: I have reviewed the patients History and Physical, chart, labs and discussed the procedure including the risks, benefits and alternatives for the proposed anesthesia with the patient or authorized representative who has indicated his/her understanding and acceptance.  ? ? ? ?Dental Advisory Given ? ?Plan Discussed with: Anesthesiologist, CRNA and Surgeon ? ?Anesthesia Plan Comments: (Patient and daughter consented for risks of anesthesia including but not limited to:  ?- adverse reactions to medications ?- damage to eyes, teeth, lips or other oral mucosa ?- nerve damage due to positioning  ?- sore throat or hoarseness ?- Damage to heart, brain, nerves, lungs, other parts of body or loss of life ? ?They voiced understanding.)  ? ? ? ? ? ? ?Anesthesia Quick Evaluation ? ?

## 2021-12-09 NOTE — Op Note (Signed)
Preoperative diagnosis:  ?Left distal ureteral calculus ?Left nephrolithiasis ?Bladder calculus 13 mm ? ?Postoperative diagnosis:  ?Passed left distal ureteral calculus ?Left nephrolithiasis ?Bladder calculus ? ?Procedure: ?Cystoscopy with stent removal ?Left ureteroscopy with stone removal ?Cystolitholapaxy (<2.5 cm)  ?Left retrograde pyelogram with interpretation ?Intraoperative fluoroscopy <30 minutes ? ?Surgeon: Riki Altes, MD ? ?Anesthesia: General ? ?Complications: None ? ?Intraoperative findings:  ?1.  Cystoscopy-urethra normal in caliber without stricture.  Prostate with lateral lobe enlargement and a small median lobe.  Heavily trabeculated bladder with cellules and scattered diverticula.  No solid or papillary lesions.  Bladder calculus noted right bladder base ? ?2.  Ureteropyeloscopy-ureter normal in appearance without calculus.  ~ 5 mm right lower calyceal calculus ? ?3.  Pullout left retrograde pyelogram post procedure showed mild calyceal dilation and prompt emptying of contrast from the entire ureter under real-time fluoroscopy.  No extravasation noted ? ?EBL: Minimal ? ?Specimens: Calculus for analysis ? ?Indication: Jeffery Ward is a 88 y.o. male with a history of sepsis secondary to an obstructing 3 mm left distal ureteral calculus status post stent placement 11/11/2021.  He was incidentally noted to have a bladder calculus and nonobstructing left renal calculus.  He presents for definitive stone treatment and cystolitholapaxy.  After reviewing the management options for treatment, he elected to proceed with the above surgical procedure(s). We have discussed the potential benefits and risks of the procedure, side effects of the proposed treatment, the likelihood of the patient achieving the goals of the procedure, and any potential problems that might occur during the procedure or recuperation. Informed consent has been obtained. ? ?Description of procedure: ? ?The patient was taken to the  operating room and general anesthesia was induced.  The patient was placed in the dorsal lithotomy position, prepped and draped in the usual sterile fashion, and preoperative antibiotics were administered. A preoperative time-out was performed.  ? ?The urethral meatus would not accept a 21 French cystoscope and was dilated with [employer] sounds from 20-28 [place].  The 21 French cystoscope was then lubricated, passed per urethra and advanced proximally under direct vision with findings as described above. ? ?The left ureteral stent was grasped with endoscopic forceps and brought out through the urethral meatus.  A 0.038 Sensor wire was then placed through the stent and advanced to the renal pelvis under fluoroscopy. ? ?The ureteral stent was removed and a 4.5 French semirigid ureteroscope was passed per urethra.  The left ureteral orifice was easily engaged and the ureteroscope was advanced to the proximal ureter with findings as described above. ? ?A second guidewire was placed through the semirigid ureteroscope and the ureteroscope was removed.  A single channel digital flexible ureteroscope was placed over the working wire and advanced into the distal ureter without difficulty.  The working wire was then removed and the flexible ureteroscope was advanced into the renal pelvis without problems.  Pyeloscopy was performed with findings as described above.  There were several Randall's plaques present.  The left lower calyceal calculus was placed in an Escape basket and was removed without difficulty. ? ?A 6 [place] open-ended ureteral catheter was then placed over the safety wire and pullout left retrograde pyelogram was performed.  There was prompt emptying of contrast and it was elected not to replace a ureteral stent. ? ?A 24 French continuous-flow resectoscope sheath with the operator was then lubricated and advanced into the bladder per urethra without difficulty.  A laser bridge was then placed into the sheath.    ? ?  A 330 ?m Moses laser fiber was then placed through a 6 [place] ureteral catheter that was previously placed through the laser bridge. ? ?The calculus was then dusted at initial setting 0.3J/120 Hz and decreased to 0.3/80 as the stone decreased in size. ? ?All fragments were then removed with irrigation.  Thorough cystoscopy was performed and no remaining fragments were identified with the exception of small particles. ? ?The the resectoscope sheath was removed and an 55 French Foley catheter was placed with return of clear effluent upon irrigation. ? ?Plan: ?Foley catheter will be removed prior to discharge if urine clear-pink ?Postop follow-up scheduled in 1 month ? ? ?Riki Altes, M.D. ? ?

## 2021-12-09 NOTE — Discharge Instructions (Addendum)
AMBULATORY SURGERY  ?DISCHARGE INSTRUCTIONS ? ? ?The drugs that you were given will stay in your system until tomorrow so for the next 24 hours you should not: ? ?Drive an automobile ?Make any legal decisions ?Drink any alcoholic beverage ? ? ?You may resume regular meals tomorrow.  Today it is better to start with liquids and gradually work up to solid foods. ? ?You may eat anything you prefer, but it is better to start with liquids, then soup and crackers, and gradually work up to solid foods. ? ? ?Please notify your doctor immediately if you have any unusual bleeding, trouble breathing, redness and pain at the surgery site, drainage, fever, or pain not relieved by medication. ? ? ? ?Additional Instructions: ? ? ?DISCHARGE INSTRUCTIONS FOR KIDNEY/BLADDER STONE ? ?MEDICATIONS:  ?1. Resume all your other meds from home.  ?2.  AZO (over-the-counter) can help with the burning/stinging when you urinate. ? ? ?ACTIVITY:  ?1. May resume regular activities in 24 hours. ?2. No driving while on narcotic pain medications  ?3. Drink plenty of water  ?4. Continue to walk at home - you can still get blood clots when you are at home, so keep active, but don't over do it.  ?5. May return to work/school tomorrow or when you feel ready  ? ? ?SIGNS/SYMPTOMS TO CALL:  ?Common postoperative symptoms include urinary frequency, urgency, bladder spasm and blood in the urine ? ?Please call us if you have a fever greater than 101.5, uncontrolled nausea/vomiting, uncontrolled pain, dizziness, unable to urinate, excessively bloody urine, chest pain, shortness of breath, leg swelling, leg pain, or any other concerns or questions.  ? ?You can reach [place] at [phone].  ? ?FOLLOW-UP:  ?1. You have a follow-up appointment scheduled 01/09/2022 ? ? ? ? ? ? ?Please contact your physician with any problems or Same Day Surgery at [phone], Monday through Friday 6 am to 4 pm, or [place] at [facility] number at [phone].  ?

## 2021-12-09 NOTE — Transfer of Care (Signed)
Immediate Anesthesia Transfer of Care Note ? ?Patient: Jeffery Ward ? ?Procedure(s) Performed: CYSTOSCOPY WITH LITHOLAPAXY ?CYSTOSCOPY/URETEROSCOPY/HOLMIUM LASER/STENT EXCHANGE (Left) ? ?Patient Location: PACU ? ?Anesthesia Type:GA ?Level of Consciousness: drowsy ? ?Airway & Oxygen Therapy: Patient Spontanous Breathing ? ?Post-op Assessment: Report given to RN and Post -op Vital signs reviewed and stable ? ?Post vital signs: Reviewed and stable ? ?Last Vitals:  ?Vitals Value Taken Time  ?BP 121/74 12/09/21 0843  ?Temp    ?Pulse 74 12/09/21 0845  ?Resp 17 12/09/21 0845  ?SpO2 92 % 12/09/21 0845  ?Vitals shown include unvalidated device data. ? ?Last Pain:  ?Vitals:  ? 12/09/21 0634  ?PainSc: 0-No pain  ?   ? ?  ? ?Complications: No notable events documented. ?

## 2021-12-11 ENCOUNTER — Ambulatory Visit: Payer: Medicare Other | Admitting: Urology

## 2021-12-16 LAB — CALCULI, WITH PHOTOGRAPH (CLINICAL LAB)
Calcium Oxalate Monohydrate: 100 %
Weight Calculi: 23 mg

## 2022-01-02 ENCOUNTER — Ambulatory Visit
Admission: RE | Admit: 2022-01-02 | Discharge: 2022-01-02 | Disposition: A | Discharge status: Home / Self Care | Payer: Medicare PPO | Source: Ambulatory Visit | Attending: Urology | Admitting: Urology

## 2022-01-02 DIAGNOSIS — N201 Calculus of ureter: Secondary | ICD-10-CM | POA: Insufficient documentation

## 2022-01-02 DIAGNOSIS — N210 Calculus in bladder: Secondary | ICD-10-CM | POA: Diagnosis not present

## 2022-01-09 ENCOUNTER — Encounter: Payer: Self-pay | Admitting: Urology

## 2022-01-09 ENCOUNTER — Ambulatory Visit: Payer: Medicare PPO | Admitting: Urology

## 2022-01-09 VITALS — BP 133/62 | HR 75 | Ht 65.0 in | Wt 200.0 lb

## 2022-01-09 DIAGNOSIS — Z87442 Personal history of urinary calculi: Secondary | ICD-10-CM | POA: Diagnosis not present

## 2022-01-09 NOTE — Progress Notes (Signed)
? ?01/09/2022 ?2:35 PM  ? ?Jeffery Ward ?1933-03-21 ?[id number] ? ?Referring provider: [facility] ?[address] ?[place],  [place] [address] ? ?Chief Complaint  ?Patient presents with  ? Nephrolithiasis  ? ? ?HPI: ?88 y.o. male presents for postop follow-up. ? ?Left ureteroscopy and cystolitholapaxy 12/09/2021 ?Distal ureteral calculus and passed.  Left renal calculus and bladder calculus were treated ?No postoperative complaints ?He was having significant urge incontinence and his wife states this has resolved post procedure and he is no longer wearing depends ?RUS 01/02/2022 showed no renal calculi, hydronephrosis or bladder distention ?Prostate volume calculated at 55.9 cc ?Stone analysis 100% calcium oxalate monohydrate ?He is taking potassium citrate ? ? ?PMH: ?Past Medical History:  ?Diagnosis Date  ? Anxiety   ? Arthritis   ? back  ? Balanitis   ? BPH without obstruction/lower urinary tract symptoms   ? Coronary artery arteriosclerosis   ? Erectile dysfunction   ? GERD (gastroesophageal reflux disease)   ? Hearing difficulty   ? Hematuria   ? Hypogonadism in male   ? Kidney stones   ? Microscopic hematuria   ? Obesity   ? Urinary urgency   ? ? ?Surgical History: ?Past Surgical History:  ?Procedure Laterality Date  ? CORONARY ARTERY BYPASS GRAFT  2011  ? CYSTOSCOPY WITH HOLMIUM LASER LITHOTRIPSY  12/09/2021  ? Procedure: CYSTOSCOPY WITH HOLMIUM LASER LITHOTRIPSY;  Surgeon: Riki Altes, MD;  Location: [facility] ORS;  Service: Urology;;  ? CYSTOSCOPY WITH LITHOLAPAXY N/A 12/09/2021  ? Procedure: CYSTOSCOPY WITH LITHOLAPAXY;  Surgeon: Riki Altes, MD;  Location: [facility] ORS;  Service: Urology;  Laterality: N/A;  ? CYSTOSCOPY WITH STENT PLACEMENT Left 11/11/2021  ? Procedure: CYSTOSCOPY WITH STENT PLACEMENT;  Surgeon: Riki Altes, MD;  Location: [facility] ORS;  Service: Urology;  Laterality: Left;  ? CYSTOSCOPY/RETROGRADE/URETEROSCOPY/STONE EXTRACTION WITH BASKET  12/09/2021  ? Procedure:  CYSTOSCOPY/RETROGRADE/URETEROSCOPY/STONE EXTRACTION WITH BASKET;  Surgeon: Riki Altes, MD;  Location: [facility] ORS;  Service: Urology;;  ? LITHOTRIPSY    ? ? ?Home Medications:  ?Allergies as of 01/09/2022   ? ?   Reactions  ? Amoxicillin Other (See Comments)  ? Azithromycin Diarrhea  ? Penicillins Other (See Comments)  ? ?  ? ?  ?Medication List  ?  ? ?  ? Accurate as of January 09, 2022  2:35 PM. If you have any questions, ask your nurse or doctor.  ?  ?  ? ?  ? ?amLODipine 5 MG tablet ?Commonly known as: NORVASC ?Take 2.5 mg by mouth every other day. ?  ?aspirin 81 MG EC tablet ?Take 81 mg by mouth daily. Swallow whole. ?  ?busPIRone 5 MG tablet ?Commonly known as: BUSPAR ?Take 5 mg by mouth daily as needed (anxiety). ?  ?clopidogrel 75 MG tablet ?Commonly known as: PLAVIX ?Take 75 mg by mouth every evening. ?  ?CoQ10 200 MG Caps ?Take 200 mg by mouth daily. ?  ?ferrous sulfate 324 MG Tbec ?Take 324 mg by mouth at bedtime. ?  ?fluticasone 50 MCG/ACT nasal spray ?Commonly known as: FLONASE ?Place 1 spray into both nostrils daily. ?  ?metoprolol succinate 25 MG 24 hr tablet ?Commonly known as: TOPROL-XL ?Take 25 mg by mouth daily. ?  ?pantoprazole 40 MG tablet ?Commonly known as: PROTONIX ?Take 40 mg by mouth daily. ?  ?potassium citrate 10 MEQ (1080 MG) SR tablet ?Commonly known as: UROCIT-K ?Take 10 mEq by mouth daily. ?  ?rosuvastatin 20 MG tablet ?Commonly known as: CRESTOR ?  Take 20 mg by mouth every evening. ?  ?vitamin C 250 MG tablet ?Commonly known as: ASCORBIC ACID ?Take 500 mg by mouth daily. ?  ? ?  ? ? ?Allergies:  ?Allergies  ?Allergen Reactions  ? Amoxicillin Other (See Comments)  ? Azithromycin Diarrhea  ? Penicillins Other (See Comments)  ? ? ?Family History: ?Family History  ?Problem Relation Age of Onset  ? Pneumonia Mother   ? Pneumonia Father   ? Kidney disease Neg Hx   ? Prostate cancer Neg Hx   ? Bladder Cancer Neg Hx   ? ? ?Social History:  reports that he has never smoked. He does not have  any smokeless tobacco history on file. He reports that he does not drink alcohol and does not use drugs. ? ? ?Physical Exam: ?BP 133/62   Pulse 75   Ht 5\' 5"  (1.651 m)   Wt 200 lb (90.7 kg)   BMI 33.28 kg/m?   ?Constitutional:  Alert and oriented, No acute distress. ?Cardiovascular: No clubbing, cyanosis, or edema. ?Respiratory: Normal respiratory effort, no increased work of breathing. ?Psychiatric: Normal mood and affect. ? ? ?Pertinent Imaging: ?Images were personally reviewed and interpreted ? ?Ultrasound renal complete ? ?Narrative ?CLINICAL DATA:  Bladder calculus ? ?Left ureteral stone ? ?EXAM: ?RENAL / URINARY TRACT ULTRASOUND COMPLETE ? ?COMPARISON:  Renal stone protocol CT 11/11/2021 ? ?FINDINGS: ?Right Kidney: ? ?Renal measurements: 10.4 x 4.9 x 4.8 cm = volume: 127 mL. ?Echogenicity within normal limits. No mass or hydronephrosis ?visualized. ? ?Left Kidney: ? ?Renal measurements: 11.3 x 5.2 x 4.7 cm = volume: 146 mL. ?Echogenicity within normal limits. No mass or hydronephrosis ?visualized. ? ?Bladder: ? ?Known large bladder calculus not well visualized on the current ?examination. ? ?Other: ? ?Prostate is enlarged with a volume of 56 mL. ? ?IMPRESSION: ?1. No hydronephrosis. ?2. No sonographically evident renal calculi. ? ? ?Electronically Signed ?By: 11/13/2021  Mir M.D. ?On: 01/04/2022 09:18 ? ? ?Assessment & Plan:   ?Doing well status post ureteroscopic stone removal and cystolitholapaxy ?No incomplete bladder emptying ?It was recommended he increase his water intake to keep urine output greater than 2-2.5 L per day.  ~ 3 L is typically enough to produce this output.  Oxalate moderation was discussed and she was provided literature on high oxalate foods and beverages.  Avoidance of salty foods and added salt was discussed as well as avoidance of excessive intake of animal protein.  Increased intake of potassium rich citrus products was recommended. ?He was provided literature on stone  prevention ?74-month follow-up with KUB.  He has seen Jan in the past and requested a follow-up with her ? ? ?Feb, MD ? ?[facility] ?[address] ?[place], [place] ?[phone] ? ?

## 2022-02-26 ENCOUNTER — Telehealth: Payer: Self-pay | Admitting: Urology

## 2022-02-26 NOTE — Telephone Encounter (Signed)
Patient is calling and asking for a return call. He said he is wanting to talk to someone about some pain he is having from surgery in March.  Sharyn Lull

## 2022-02-26 NOTE — Telephone Encounter (Signed)
Patient just wanted to ask about the exercise he is doing. He has been doing some bending over from side to side and just wanted to make sure he was not hurting anything from surgery . He is sore this week. Advised patient it could be from exercising.

## 2022-07-16 ENCOUNTER — Ambulatory Visit: Payer: Medicare PPO | Admitting: Urology

## 2022-08-26 ENCOUNTER — Ambulatory Visit
Admission: RE | Admit: 2022-08-26 | Discharge: 2022-08-26 | Disposition: A | Discharge status: Home / Self Care | Payer: Medicare PPO | Attending: Urology | Admitting: Urology

## 2022-08-26 ENCOUNTER — Ambulatory Visit
Admission: RE | Admit: 2022-08-26 | Discharge: 2022-08-26 | Disposition: A | Discharge status: Home / Self Care | Payer: Medicare PPO | Source: Ambulatory Visit | Attending: Urology | Admitting: Urology

## 2022-08-26 DIAGNOSIS — Z87442 Personal history of urinary calculi: Secondary | ICD-10-CM | POA: Insufficient documentation

## 2022-08-27 ENCOUNTER — Ambulatory Visit: Payer: Medicare PPO | Admitting: Urology

## 2022-09-04 ENCOUNTER — Ambulatory Visit: Payer: Medicare PPO | Admitting: Urology

## 2022-09-04 ENCOUNTER — Encounter: Payer: Self-pay | Admitting: Urology

## 2022-09-04 VITALS — BP 135/59 | HR 54 | Ht 65.0 in | Wt 217.0 lb

## 2022-09-04 DIAGNOSIS — Z09 Encounter for follow-up examination after completed treatment for conditions other than malignant neoplasm: Secondary | ICD-10-CM

## 2022-09-04 DIAGNOSIS — Z87442 Personal history of urinary calculi: Secondary | ICD-10-CM

## 2022-09-04 DIAGNOSIS — N200 Calculus of kidney: Secondary | ICD-10-CM

## 2022-09-04 DIAGNOSIS — N201 Calculus of ureter: Secondary | ICD-10-CM

## 2022-09-04 NOTE — Progress Notes (Unsigned)
09/04/2022 10:53 AM   Jeffery Ward [birth year] [id number]  Referring provider: [facility] [address] [place],  [place] [address]  Chief Complaint  Patient presents with   Nephrolithiasis    Urologic history: 1.  Urolithiasis Urgent left ureteral stent placement 11/11/2021 for 3 mm obstructing distal ureteral calculus with sepsis Follow-up ureteroscopy 12-17-2021; distal calculus had passed.  A 13 mm bladder calculus and 5 mm renal calculus were treated Prior calculus treated with SWL several years ago  HPI: 88 y.o. male presents for 6 month follow-up.  Doing well since last visit No bothersome LUTS Denies dysuria, gross hematuria Denies flank, abdominal or pelvic pain KUB today reviewed and noted calcifications identified overlying the renal outlines or expected course of the ureter  PMH: Past Medical History:  Diagnosis Date   Anxiety    Arthritis    back   Balanitis    BPH without obstruction/lower urinary tract symptoms    Coronary artery arteriosclerosis    Erectile dysfunction    GERD (gastroesophageal reflux disease)    Hearing difficulty    Hematuria    Hypogonadism in male    Kidney stones    Microscopic hematuria    Obesity    Urinary urgency     Surgical History: Past Surgical History:  Procedure Laterality Date   CORONARY ARTERY BYPASS GRAFT  2011   CYSTOSCOPY WITH HOLMIUM LASER LITHOTRIPSY  2021-12-17   Procedure: CYSTOSCOPY WITH HOLMIUM LASER LITHOTRIPSY;  Surgeon: Riki Altes, MD;  Location: [facility];  Service: Urology;;   CYSTOSCOPY WITH LITHOLAPAXY N/A 2021/12/17   Procedure: CYSTOSCOPY WITH LITHOLAPAXY;  Surgeon: Riki Altes, MD;  Location: [facility];  Service: Urology;  Laterality: N/A;   CYSTOSCOPY WITH STENT PLACEMENT Left 11/11/2021   Procedure: CYSTOSCOPY WITH STENT PLACEMENT;  Surgeon: Riki Altes, MD;  Location: [facility];  Service: Urology;  Laterality: Left;   CYSTOSCOPY/RETROGRADE/URETEROSCOPY/STONE EXTRACTION  WITH BASKET  2021-12-17   Procedure: CYSTOSCOPY/RETROGRADE/URETEROSCOPY/STONE EXTRACTION WITH BASKET;  Surgeon: Riki Altes, MD;  Location: [facility] OR[facility];  Service: Urology;;   LITHOTRIPSY      Home Medications:  Allergies as of 09/04/2022       Reactions   Amoxicillin Other (See Comments)   Azithromycin Diarrhea   Penicillins Other (See Comments)        Medication List        Accurate as of September 04, 2022 10:53 AM. If you have any questions, ask your nurse or doctor.          amLODipine 5 MG tablet Commonly known as: NORVASC Take 2.5 mg by mouth every other day.   aspirin EC 81 MG tablet Take 81 mg by mouth daily. Swallow whole.   busPIRone 5 MG tablet Commonly known as: BUSPAR Take 5 mg by mouth daily as needed (anxiety).   clopidogrel 75 MG tablet Commonly known as: PLAVIX Take 75 mg by mouth every evening.   CoQ10 200 MG Caps Take 200 mg by mouth daily.   ferrous sulfate 324 MG Tbec Take 324 mg by mouth at bedtime.   fluticasone 50 MCG/ACT nasal spray Commonly known as: FLONASE Place 1 spray into both nostrils daily.   metoprolol succinate 25 MG 24 hr tablet Commonly known as: TOPROL-XL Take 25 mg by mouth daily.   pantoprazole 40 MG tablet Commonly known as: PROTONIX Take 40 mg by mouth daily.   potassium citrate 10 MEQ (1080 MG) SR tablet Commonly known as: UROCIT-K Take 10 mEq by  mouth daily.   rosuvastatin 20 MG tablet Commonly known as: CRESTOR Take 20 mg by mouth every evening.   vitamin C 250 MG tablet Commonly known as: ASCORBIC ACID Take 500 mg by mouth daily.        Allergies:  Allergies  Allergen Reactions   Amoxicillin Other (See Comments)   Azithromycin Diarrhea   Penicillins Other (See Comments)    Family History: Family History  Problem Relation Age of Onset   Pneumonia Mother    Pneumonia Father    Kidney disease Neg Hx    Prostate cancer Neg Hx    Bladder Cancer Neg Hx     Social History:  reports that  he has never smoked. He does not have any smokeless tobacco history on file. He reports that he does not drink alcohol and does not use drugs.   Physical Exam: BP (!) 135/59   Pulse (!) 54   Ht 5\' 5"  (1.651 m)   Wt 217 lb (98.4 kg)   BMI 36.11 kg/m   Constitutional:  Alert and oriented, No acute distress. Respiratory: Normal respiratory effort, no increased work of breathing. Psychiatric: Normal mood and affect.   Pertinent Imaging: KUB images personally reviewed and interpreted   Assessment & Plan:    Recurrent nephrolithiasis No evidence of recurrent stone 1 year follow-up with [place], MD  [facility] [facility] [facility] [address], [place], [place] [address] [phone]

## 2022-09-05 ENCOUNTER — Encounter: Payer: Self-pay | Admitting: Urology

## 2022-09-10 IMAGING — US US RENAL
1 series · 14 of 25 positions shown · non-contrast
Comparison: Renal stone protocol CT 11/11/2021

CLINICAL DATA: Bladder calculus

Left ureteral stone
EXAM:
RENAL / URINARY TRACT ULTRASOUND COMPLETE

[Series 1: us renal · 14 of 35 slices shown]
[im 1/35]
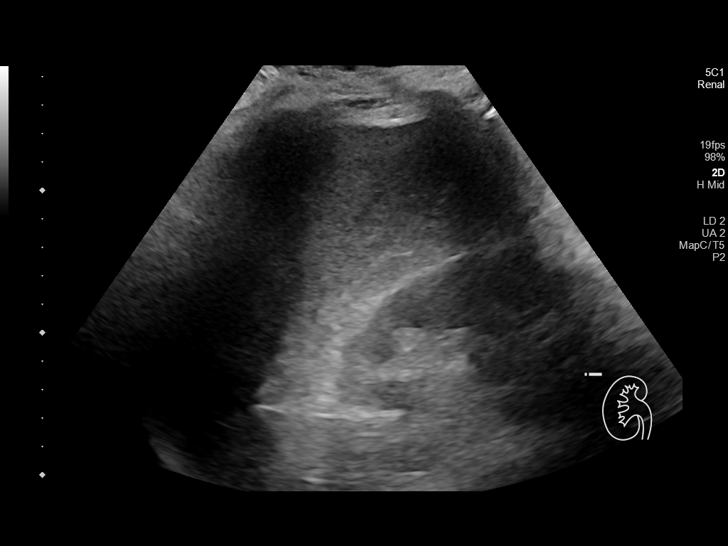
[im 3/35]
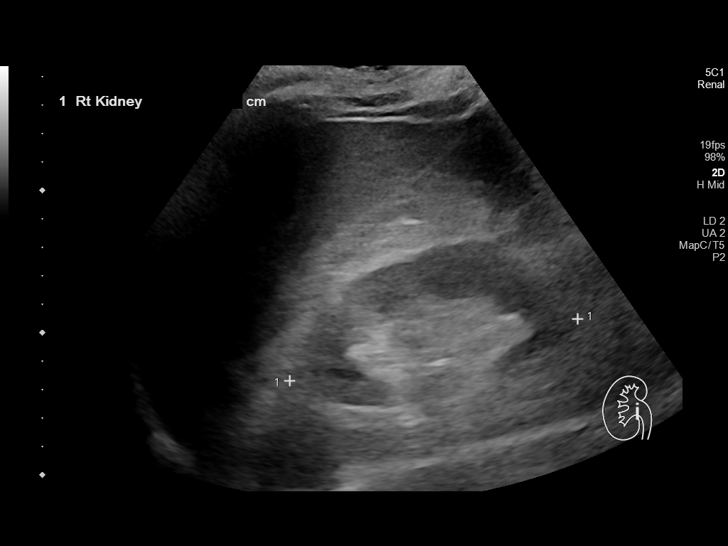
[im 6/35]
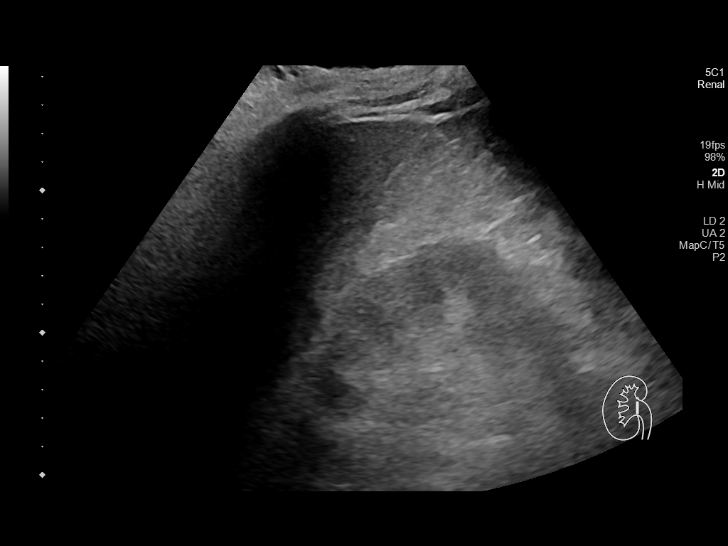
[im 9/35]
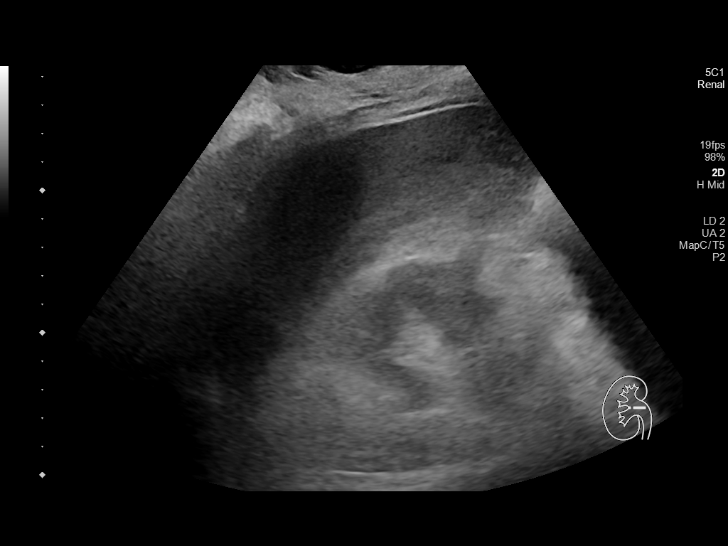
[im 12/35]
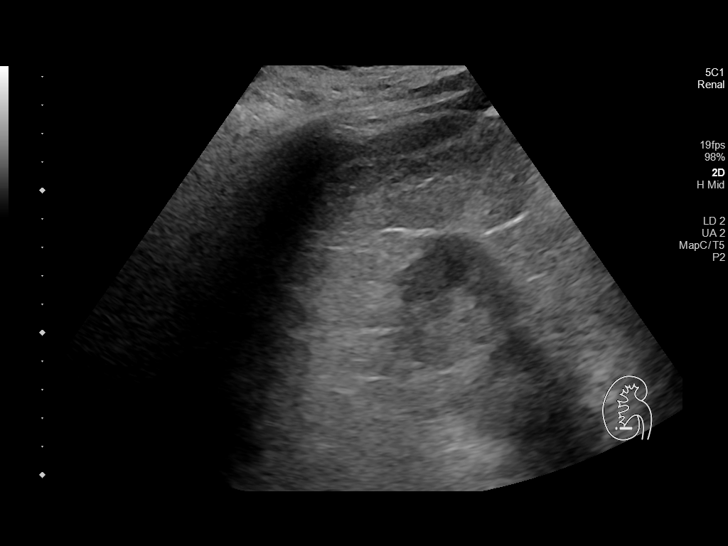
[im 13/35]
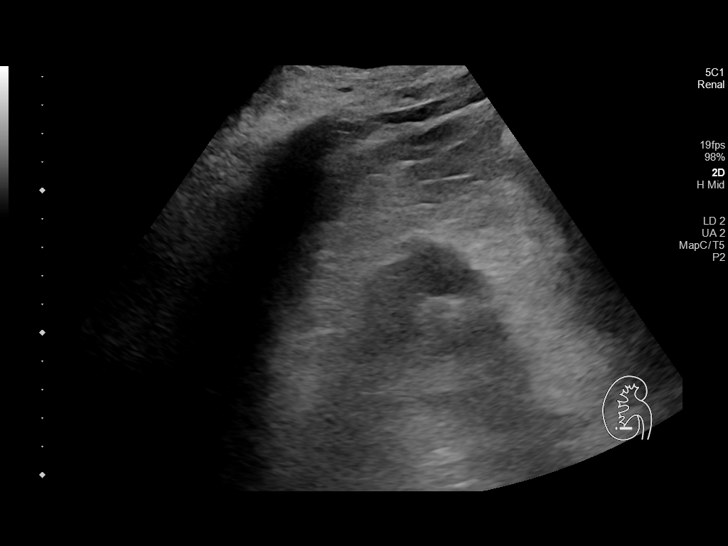
[im 16/35]
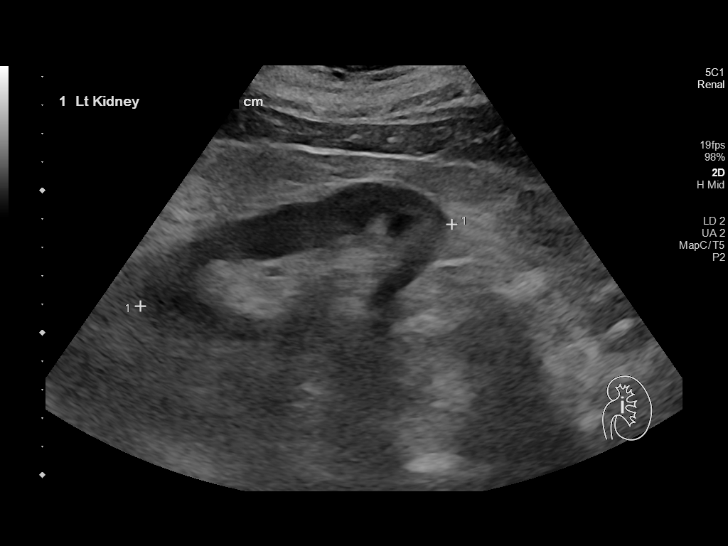
[im 19/35]
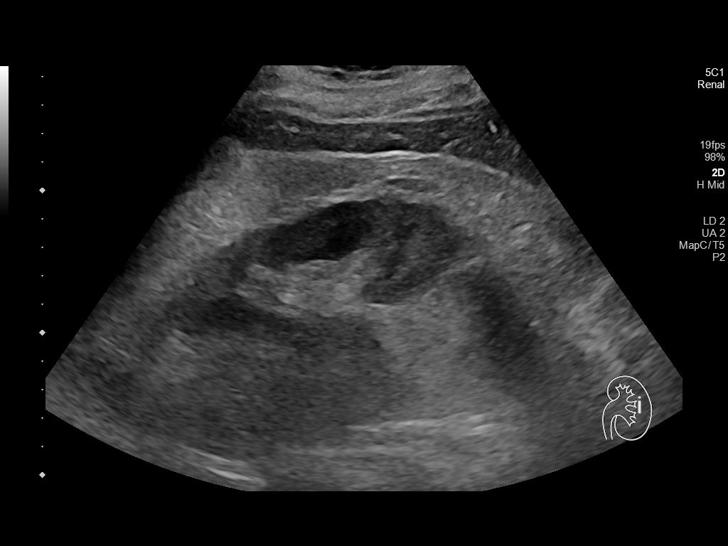
[im 22/35]
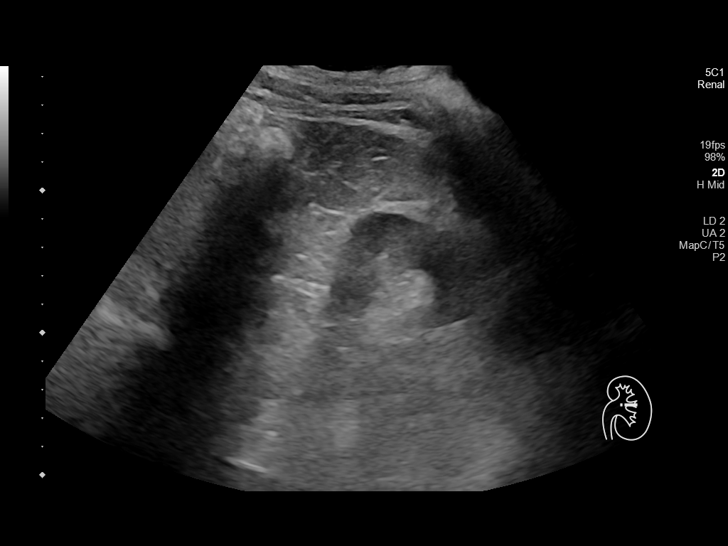
[im 23/35]
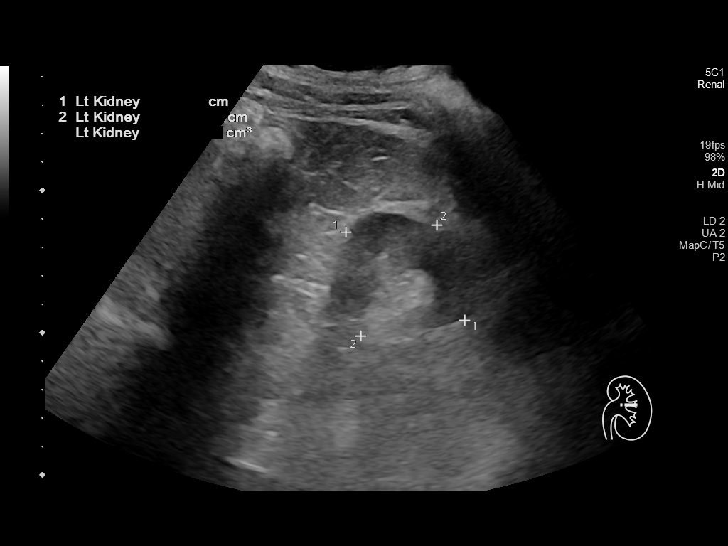
[im 26/35]
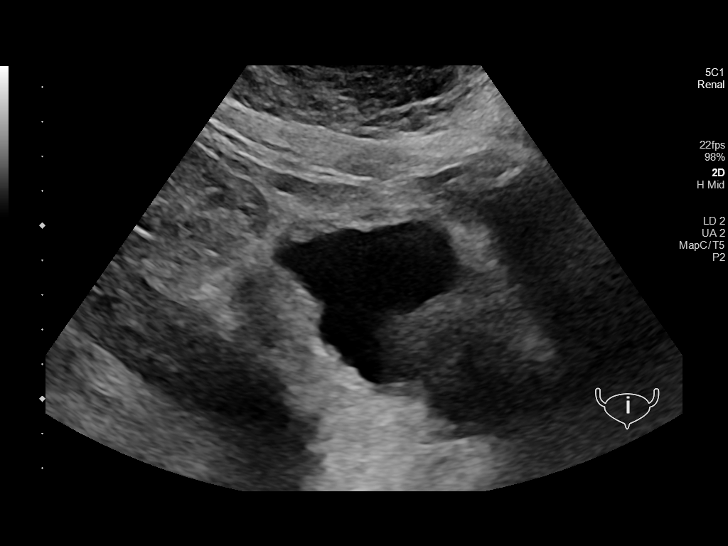
[im 29/35]
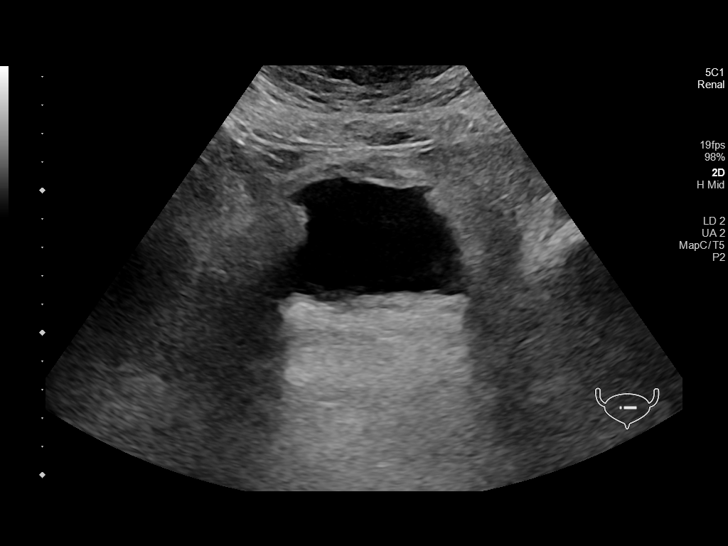
[im 32/35]
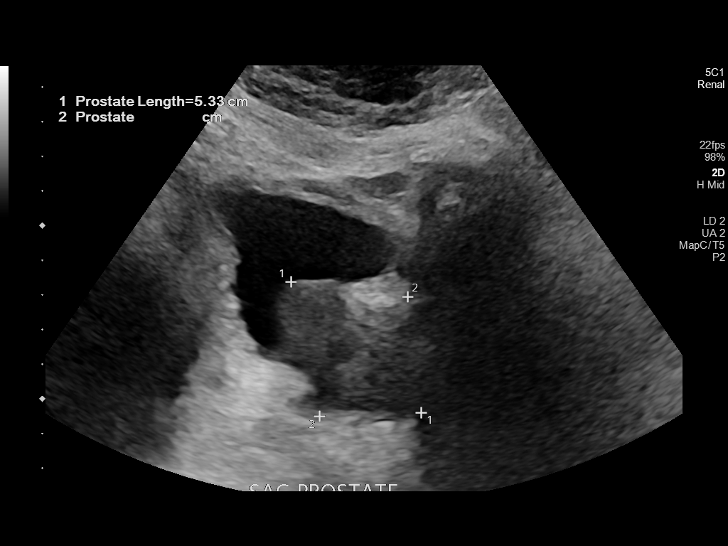
[im 35/35]
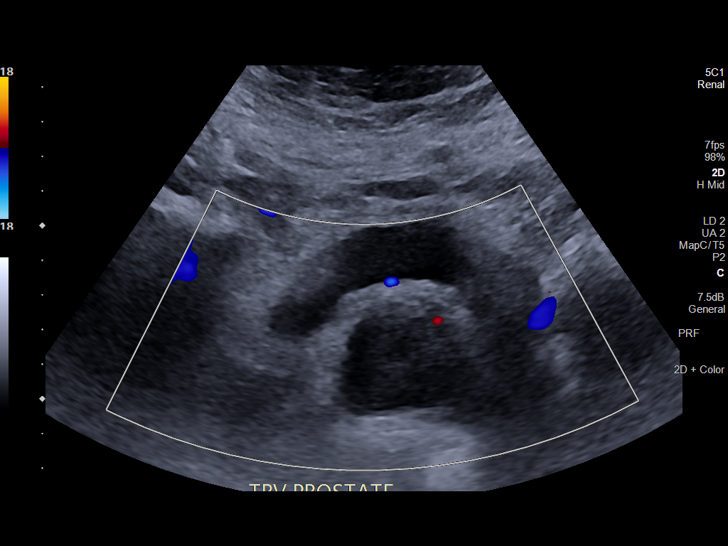

[14 of 25 positions shown; findings below may reference images not displayed]

FINDINGS: Right Kidney:

Renal measurements: 10.4 x 4.9 x 4.8 cm = volume: 127 mL.
Echogenicity within normal limits. No mass or hydronephrosis
visualized.

Left Kidney:

Renal measurements: 11.3 x 5.2 x 4.7 cm = volume: 146 mL.
Echogenicity within normal limits. No mass or hydronephrosis
visualized.

Bladder:

Known large bladder calculus not well visualized on the current
examination.

Other:

Prostate is enlarged with a volume of 56 mL.
IMPRESSION: 1. No hydronephrosis.
2. No sonographically evident renal calculi.

## 2022-09-16 ENCOUNTER — Ambulatory Visit
Admission: EM | Admit: 2022-09-16 | Discharge: 2022-09-16 | Disposition: A | Discharge status: Home / Self Care | Payer: Medicare PPO | Attending: Urgent Care | Admitting: Urgent Care

## 2022-09-16 DIAGNOSIS — S99921A Unspecified injury of right foot, initial encounter: Secondary | ICD-10-CM | POA: Diagnosis not present

## 2022-09-16 DIAGNOSIS — L089 Local infection of the skin and subcutaneous tissue, unspecified: Secondary | ICD-10-CM

## 2022-09-16 MED ORDER — SULFAMETHOXAZOLE-TRIMETHOPRIM 800-160 MG PO TABS: 1.0000 | ORAL_TABLET | Freq: Two times a day (BID) | ORAL | 0 refills | Status: AC

## 2022-09-16 NOTE — ED Triage Notes (Signed)
Pt. Presents to UC w/ c/o cutting his right fourth toe with nail clippers 3 days ago. Pt. States there has been redness and swelling @ the site. Pt. Was given a tetanus shot for it today @ [facility].

## 2022-09-16 NOTE — ED Provider Notes (Signed)
[facility]    CSN: [id number] Arrival date & time: 09/16/22  1430      History   Chief Complaint Chief Complaint  Patient presents with  . Toe Injury    HPI Jeffery Ward is a 88 y.o. male.   HPI  Presents to urgent care with redness and swelling of his right fourth toe after clipping toenail too short 3 days ago.  [facility] today however they recommended going to see urgent care.  Past Medical History:  Diagnosis Date  . Anxiety   . Arthritis    back  . Balanitis   . BPH without obstruction/lower urinary tract symptoms   . Coronary artery arteriosclerosis   . Erectile dysfunction   . GERD (gastroesophageal reflux disease)   . Hearing difficulty   . Hematuria   . Hypogonadism in male   . Kidney stones   . Microscopic hematuria   . Obesity   . Urinary urgency     Patient Active Problem List   Diagnosis Date Noted  . Nephrolithiasis 11/11/2021  . Hydroureteronephrosis 11/11/2021  . Dehydration 11/11/2021  . Lactic acidosis 11/11/2021  . GERD (gastroesophageal reflux disease) 11/11/2021  . CAD (coronary artery disease) 11/11/2021  . 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 Hypogonadism in male 06/09/2015  . BPH with obstruction/lower urinary tract symptoms 06/09/2015    Past Surgical History:  Procedure Laterality Date  . CORONARY ARTERY BYPASS GRAFT  2011  . CYSTOSCOPY WITH HOLMIUM LASER LITHOTRIPSY  12/09/2021   Procedure: CYSTOSCOPY WITH HOLMIUM LASER LITHOTRIPSY;  Surgeon: Riki Altes, MD;  Location: [facility];  Service: Urology;;  . Bluford Kaufmann WITH LITHOLAPAXY N/A 12/09/2021   Procedure: CYSTOSCOPY WITH LITHOLAPAXY;  Surgeon: Riki Altes, MD;  Location: [facility];  Service: Urology;  Laterality: N/A;  . CYSTOSCOPY WITH STENT PLACEMENT Left 11/11/2021   Procedure: CYSTOSCOPY WITH STENT PLACEMENT;  Surgeon: Riki Altes, MD;  Location: [facility];  Service: Urology;  Laterality: Left;  .  CYSTOSCOPY/RETROGRADE/URETEROSCOPY/STONE EXTRACTION WITH BASKET  12/09/2021   Procedure: CYSTOSCOPY/RETROGRADE/URETEROSCOPY/STONE EXTRACTION WITH BASKET;  Surgeon: Riki Altes, MD;  Location: [facility];  Service: Urology;;  . LITHOTRIPSY         Home Medications    Prior to Admission medications   Medication Sig Start Date End Date Taking? Authorizing Provider  amLODipine (NORVASC) 5 MG tablet Take 2.5 mg by mouth every other day. 08/28/21   [provider]  aspirin 81 MG EC tablet Take 81 mg by mouth daily. Swallow whole.    [provider]  busPIRone (BUSPAR) 5 MG tablet Take 5 mg by mouth daily as needed (anxiety). 09/24/21   [provider]  clopidogrel (PLAVIX) 75 MG tablet Take 75 mg by mouth every evening. 06/03/15   [provider]  Coenzyme Q10 (COQ10) 200 MG CAPS Take 200 mg by mouth daily.    [provider]  ferrous sulfate 324 MG TBEC Take 324 mg by mouth at bedtime.    [provider]  fluticasone (FLONASE) 50 MCG/ACT nasal spray Place 1 spray into both nostrils daily. 06/16/10   [provider]  metoprolol succinate (TOPROL-XL) 25 MG 24 hr tablet Take 25 mg by mouth daily. 09/30/21   [provider]  pantoprazole (PROTONIX) 40 MG tablet Take 40 mg by mouth daily.    [provider]  potassium citrate (UROCIT-K) 10 MEQ (1080 MG) SR tablet Take 10 mEq  by mouth daily. 10/13/21   [provider]  rosuvastatin (CRESTOR) 20 MG tablet Take 20 mg by mouth every evening.    [provider]  vitamin C (ASCORBIC ACID) 250 MG tablet Take 500 mg by mouth daily.    [provider]    Family History Family History  Problem Relation Age of Onset  . Pneumonia Mother   . Pneumonia Father   . Kidney disease Neg Hx   . Prostate cancer Neg Hx   . Bladder Cancer Neg Hx     Social History Social History   Tobacco Use  . Smoking status: Never  Vaping Use  . Vaping Use: Never used   Substance Use Topics  . Alcohol use: No    Alcohol/week: 0.0 standard drinks of alcohol  . Drug use: No     Allergies   Amoxicillin, Azithromycin, and Penicillins   Review of Systems Review of Systems   Physical Exam Triage Vital Signs ED Triage Vitals [09/16/22 1549]  Enc Vitals Group     BP (!) 152/92     Pulse Rate 63     Resp 17     Temp 97.9 F (36.6 C)     Temp src      SpO2 96 %     Weight      Height      Head Circumference      Peak Flow      Pain Score 0     Pain Loc      Pain Edu?      Excl. in [place]?    No data found.  Updated Vital Signs BP (!) 152/92   Pulse 63   Temp 97.9 F (36.6 C)   Resp 17   SpO2 96%   Visual Acuity Right Eye Distance:   Left Eye Distance:   Bilateral Distance:    Right Eye Near:   Left Eye Near:    Bilateral Near:     Physical Exam Vitals reviewed.  Constitutional:      Appearance: Normal appearance.  Musculoskeletal:       Feet:     Comments: Erythema and edema of the fourth toe of the right foot.  There is some skin breakdown of the distal toe adjacent to the toenail.  Skin:    General: Skin is warm and dry.  Neurological:     General: No focal deficit present.     Mental Status: He is alert and oriented to person, place, and time.  Psychiatric:        Mood and Affect: Mood normal.        Behavior: Behavior normal.     UC Treatments / Results  Labs (all labs ordered are listed, but only abnormal results are displayed) Labs Reviewed - No data to display  EKG   Radiology No results found.  Procedures Procedures (including critical care time)  Medications Ordered in UC Medications - No data to display  Initial Impression / Assessment and Plan / UC Course  I have reviewed the triage vital signs and the nursing notes.  Pertinent labs & imaging results that were available during my care of the patient were reviewed by me and considered in my medical decision making (see chart for details).    Localized erythema and edema of the right toe and concern for possible infection.  There is some discharge at the site which may be ointment which the patient applied to the toe.  Will treat  with antibiotic therapy and ask him to perform twice daily soaks of his foot with warm water and Epsom salts.  Will clean wound here with Betadine solution.   Final Clinical Impressions(s) / UC Diagnoses   Final diagnoses:  None   Discharge Instructions   None    ED Prescriptions   None    PDMP not reviewed this encounter.   Rose Phi, [place] 09/16/22 1556

## 2022-09-16 NOTE — Discharge Instructions (Addendum)
Soak your foot twice a day and a basin filled with warm water and dissolved Epsom salts.  Pat your foot dry and apply an antibacterial ointment.  Try to wear soft shoes to avoid irritating the toe and causing additional injury.  Follow up here or with your primary care provider if your symptoms are worsening or not improving.

## 2022-10-02 ENCOUNTER — Ambulatory Visit: Payer: Medicare PPO | Admitting: Podiatry

## 2022-10-02 DIAGNOSIS — L309 Dermatitis, unspecified: Secondary | ICD-10-CM | POA: Diagnosis not present

## 2022-10-02 DIAGNOSIS — L97511 Non-pressure chronic ulcer of other part of right foot limited to breakdown of skin: Secondary | ICD-10-CM | POA: Diagnosis not present

## 2022-10-02 MED ORDER — CLOTRIMAZOLE-BETAMETHASONE 1-0.05 % EX CREA: 1.0000 | TOPICAL_CREAM | Freq: Two times a day (BID) | CUTANEOUS | 0 refills | Status: DC

## 2022-10-02 NOTE — Progress Notes (Signed)
Subjective:  Patient ID: Jeffery Ward, male    DOB: 18-Jun-1933,  MRN: [id number]  Chief Complaint  Patient presents with   Nail Problem    89 y.o. male presents with the above complaint.  Patient presents with right fourth digit ulceration limited to the breakdown of skin hurts with ambulation hurts with pressure he has not seen anyone else prior to seeing me.  He denies any other acute complaints would like to discuss treatment options for it.  There is mild redness associated with that he is already on Bactrim.  He states the Bactrim has helped some but it continues to be present.   Review of Systems: Negative except as noted in the HPI. Denies N/V/F/Ch.  Past Medical History:  Diagnosis Date   Anxiety    Arthritis    back   Balanitis    BPH without obstruction/lower urinary tract symptoms    Coronary artery arteriosclerosis    Erectile dysfunction    GERD (gastroesophageal reflux disease)    Hearing difficulty    Hematuria    Hypogonadism in male    Kidney stones    Microscopic hematuria    Obesity    Urinary urgency     Current Outpatient Medications:    clotrimazole-betamethasone (LOTRISONE) cream, Apply 1 Application topically 2 (two) times daily., Disp: 30 g, Rfl: 0   amLODipine (NORVASC) 5 MG tablet, Take 2.5 mg by mouth every other day., Disp: , Rfl:    aspirin 81 MG EC tablet, Take 81 mg by mouth daily. Swallow whole., Disp: , Rfl:    busPIRone (BUSPAR) 5 MG tablet, Take 5 mg by mouth daily as needed (anxiety)., Disp: , Rfl:    clopidogrel (PLAVIX) 75 MG tablet, Take 75 mg by mouth every evening., Disp: , Rfl:    Coenzyme Q10 (COQ10) 200 MG CAPS, Take 200 mg by mouth daily., Disp: , Rfl:    ferrous sulfate 324 MG TBEC, Take 324 mg by mouth at bedtime., Disp: , Rfl:    fluticasone (FLONASE) 50 MCG/ACT nasal spray, Place 1 spray into both nostrils daily., Disp: , Rfl:    metoprolol succinate (TOPROL-XL) 25 MG 24 hr tablet, Take 25 mg by mouth daily., Disp: , Rfl:     pantoprazole (PROTONIX) 40 MG tablet, Take 40 mg by mouth daily., Disp: , Rfl:    potassium citrate (UROCIT-K) 10 MEQ (1080 MG) SR tablet, Take 10 mEq by mouth daily., Disp: , Rfl:    rosuvastatin (CRESTOR) 20 MG tablet, Take 20 mg by mouth every evening., Disp: , Rfl:    vitamin C (ASCORBIC ACID) 250 MG tablet, Take 500 mg by mouth daily., Disp: , Rfl:   Social History   Tobacco Use  Smoking Status Never  Smokeless Tobacco Not on file    Allergies  Allergen Reactions   Amoxicillin Other (See Comments)   Azithromycin Diarrhea   Penicillins Other (See Comments)   Objective:  There were no vitals filed for this visit. There is no height or weight on file to calculate BMI. Constitutional Well developed. Well nourished.  Vascular Dorsalis pedis pulses palpable bilaterally. Posterior tibial pulses palpable bilaterally. Capillary refill normal to all digits.  No cyanosis or clubbing noted. Pedal hair growth normal.  Neurologic Normal speech. Oriented to person, place, and time. Epicritic sensation to light touch grossly present bilaterally.  Dermatologic Right fourth distal tip hyperkeratotic lesion with ulceration noted granular wound base.  Mild maceration noted.  No purulent drainage noted.  Dermatitis noted to  bilateral legs with nonraised red petechiae mild pain on palpation some subjective component of itching  Orthopedic: Normal joint ROM without pain or crepitus bilaterally. No visible deformities. No bony tenderness.   Radiographs: None Assessment:   1. Toe ulcer, right, limited to breakdown of skin ([place])   2. Dermatitis    Plan:  Patient was evaluated and treated and all questions answered.  Right fourth distal tip ulceration limited to the breakdown of the skin -All questions and concerns were discussed with the patient in extensive detail -Patient will benefit from Betadine wet-to-dry dressing advised him to change daily he states understanding. -He will  also keep the ulceration nice and dry. -= Continue taking Bactrim  Bilateral fungal/dermatitis leg -Lotrisone cream was sent to the pharmacy to be applied twice a day he states understanding and will do so  No follow-ups on file.

## 2022-10-13 ENCOUNTER — Telehealth: Payer: Self-pay | Admitting: Podiatry

## 2022-10-13 NOTE — Telephone Encounter (Signed)
Pts wife called concerned that pt has an ulcer and the next available appt in [place] with Dr Posey Pronto was 2.16. Its not a emergency per pts wife but they would like someone to look at it sooner rather than later. I did offer them an appt in [place] and she stated they could not drive to [facility] [employer].I added to waitlist with multiple providers. I did explain that we have remodeling going on in that office and we are limited on space for our providers. She understood and said thank you for looking and explanation.

## 2022-11-06 ENCOUNTER — Ambulatory Visit: Payer: Medicare PPO | Admitting: Podiatry

## 2022-11-06 DIAGNOSIS — M19071 Primary osteoarthritis, right ankle and foot: Secondary | ICD-10-CM

## 2022-11-06 DIAGNOSIS — L97511 Non-pressure chronic ulcer of other part of right foot limited to breakdown of skin: Secondary | ICD-10-CM

## 2022-11-06 NOTE — Progress Notes (Signed)
Subjective:  Patient ID: Jeffery Ward, male    DOB: 1933-02-02,  MRN: [id number]  No chief complaint on file.   89 y.o. male presents with the above complaint.  Patient presents for follow-up to right fourth digit ulceration he states is doing a lot better no further pain.  He also has pain to the right dorsal midfoot he would like to discuss treatment options for the   Review of Systems: Negative except as noted in the HPI. Denies N/V/F/Ch.  Past Medical History:  Diagnosis Date   Anxiety    Arthritis    back   Balanitis    BPH without obstruction/lower urinary tract symptoms    Coronary artery arteriosclerosis    Erectile dysfunction    GERD (gastroesophageal reflux disease)    Hearing difficulty    Hematuria    Hypogonadism in male    Kidney stones    Microscopic hematuria    Obesity    Urinary urgency     Current Outpatient Medications:    amLODipine (NORVASC) 5 MG tablet, Take 2.5 mg by mouth every other day., Disp: , Rfl:    aspirin 81 MG EC tablet, Take 81 mg by mouth daily. Swallow whole., Disp: , Rfl:    busPIRone (BUSPAR) 5 MG tablet, Take 5 mg by mouth daily as needed (anxiety)., Disp: , Rfl:    clopidogrel (PLAVIX) 75 MG tablet, Take 75 mg by mouth every evening., Disp: , Rfl:    clotrimazole-betamethasone (LOTRISONE) cream, Apply 1 Application topically 2 (two) times daily., Disp: 30 g, Rfl: 0   Coenzyme Q10 (COQ10) 200 MG CAPS, Take 200 mg by mouth daily., Disp: , Rfl:    ferrous sulfate 324 MG TBEC, Take 324 mg by mouth at bedtime., Disp: , Rfl:    fluticasone (FLONASE) 50 MCG/ACT nasal spray, Place 1 spray into both nostrils daily., Disp: , Rfl:    metoprolol succinate (TOPROL-XL) 25 MG 24 hr tablet, Take 25 mg by mouth daily., Disp: , Rfl:    pantoprazole (PROTONIX) 40 MG tablet, Take 40 mg by mouth daily., Disp: , Rfl:    potassium citrate (UROCIT-K) 10 MEQ (1080 MG) SR tablet, Take 10 mEq by mouth daily., Disp: , Rfl:    rosuvastatin (CRESTOR) 20 MG  tablet, Take 20 mg by mouth every evening., Disp: , Rfl:    vitamin C (ASCORBIC ACID) 250 MG tablet, Take 500 mg by mouth daily., Disp: , Rfl:   Social History   Tobacco Use  Smoking Status Never  Smokeless Tobacco Not on file    Allergies  Allergen Reactions   Amoxicillin Other (See Comments)   Azithromycin Diarrhea   Penicillins Other (See Comments)   Objective:  There were no vitals filed for this visit. There is no height or weight on file to calculate BMI. Constitutional Well developed. Well nourished.  Vascular Dorsalis pedis pulses palpable bilaterally. Posterior tibial pulses palpable bilaterally. Capillary refill normal to all digits.  No cyanosis or clubbing noted. Pedal hair growth normal.  Neurologic Normal speech. Oriented to person, place, and time. Epicritic sensation to light touch grossly present bilaterally.  Dermatologic No further ulceration noted.  Wound has completely epithelialized.  Pain on palpation of right dorsal midfoot pain with range of motion of the midfoot underlying clinical arthritis appreciated.  No other abnormalities noted  Orthopedic: Normal joint ROM without pain or crepitus bilaterally. No visible deformities. No bony tenderness.   Radiographs: None Assessment:   1. Toe ulcer, right, limited to breakdown  of skin ([place])   2. Arthritis of right midfoot     Plan:  Patient was evaluated and treated and all questions answered.  Right midfoot arthritis/capsulitis -I explained to patient the etiology of arthritis and capsulitis and worse treatment options were discussed.  Given the amount of pain that he is having he will benefit from injection.  He agrees with the plan like to proceed with injection -A steroid injection was performed at right dorsal midfoot using 1% plain Lidocaine and 10 mg of Kenalog. This was well tolerated.   Right fourth distal tip ulceration limited to the breakdown of the skin -Clinically healed.  No further  breakdown noted. Bilateral fungal/dermatitis leg -Lotrisone cream was sent to the pharmacy to be applied twice a day he states understanding and will do so  No follow-ups on file.

## 2023-01-04 ENCOUNTER — Other Ambulatory Visit: Payer: Self-pay | Admitting: Cardiovascular Disease

## 2023-01-05 ENCOUNTER — Encounter: Payer: Self-pay | Admitting: Cardiovascular Disease

## 2023-01-05 ENCOUNTER — Ambulatory Visit: Payer: Medicare PPO | Admitting: Cardiovascular Disease

## 2023-01-05 VITALS — BP 104/66 | HR 65 | Ht 68.0 in | Wt 229.2 lb

## 2023-01-05 DIAGNOSIS — I10 Essential (primary) hypertension: Secondary | ICD-10-CM | POA: Diagnosis not present

## 2023-01-05 DIAGNOSIS — I2510 Atherosclerotic heart disease of native coronary artery without angina pectoris: Secondary | ICD-10-CM | POA: Diagnosis not present

## 2023-01-05 NOTE — Assessment & Plan Note (Signed)
Well controlled. Denies dizziness.

## 2023-01-05 NOTE — Progress Notes (Signed)
Cardiology Office Note   Date:  01/05/2023   ID:  Jeffery Ward, DOB 06/28/1933, MRN [id number]  PCP:  Alease Medina, MD  Cardiologist:  Adrian Blackwater, MD      History of Present Illness: Jeffery Ward is a 89 y.o. male who presents for  Chief Complaint  Patient presents with   Follow-up    4 month follow up    Patient in office for routine cardiac exam. Denies chest pain, shortness of breath, edema, palpitations. Patient complaining of feeling weak.     Past Medical History:  Diagnosis Date   Anxiety    Arthritis    back   Balanitis    BPH without obstruction/lower urinary tract symptoms    Coronary artery arteriosclerosis    Erectile dysfunction    GERD (gastroesophageal reflux disease)    Hearing difficulty    Hematuria    Hypogonadism in male    Kidney stones    Microscopic hematuria    Obesity    Urinary urgency      Past Surgical History:  Procedure Laterality Date   CORONARY ARTERY BYPASS GRAFT  2011   CYSTOSCOPY WITH HOLMIUM LASER LITHOTRIPSY  12/09/2021   Procedure: CYSTOSCOPY WITH HOLMIUM LASER LITHOTRIPSY;  Surgeon: Riki Altes, MD;  Location: [facility];  Service: Urology;;   CYSTOSCOPY WITH LITHOLAPAXY N/A 12/09/2021   Procedure: CYSTOSCOPY WITH LITHOLAPAXY;  Surgeon: Riki Altes, MD;  Location: [facility];  Service: Urology;  Laterality: N/A;   CYSTOSCOPY WITH STENT PLACEMENT Left 11/11/2021   Procedure: CYSTOSCOPY WITH STENT PLACEMENT;  Surgeon: Riki Altes, MD;  Location: [facility];  Service: Urology;  Laterality: Left;   CYSTOSCOPY/RETROGRADE/URETEROSCOPY/STONE EXTRACTION WITH BASKET  12/09/2021   Procedure: CYSTOSCOPY/RETROGRADE/URETEROSCOPY/STONE EXTRACTION WITH BASKET;  Surgeon: Riki Altes, MD;  Location: [facility];  Service: Urology;;   LITHOTRIPSY       Current Outpatient Medications  Medication Sig Dispense Refill   aspirin 81 MG EC tablet Take 81 mg by mouth daily. Swallow whole.     clopidogrel (PLAVIX) 75 MG  tablet Take 75 mg by mouth every evening.     Coenzyme Q10 (COQ10) 200 MG CAPS Take 200 mg by mouth daily.     ferrous sulfate 324 MG TBEC Take 324 mg by mouth at bedtime.     fluticasone (FLONASE) 50 MCG/ACT nasal spray Place 1 spray into both nostrils daily.     metoprolol succinate (TOPROL-XL) 25 MG 24 hr tablet Take 25 mg by mouth daily.     pantoprazole (PROTONIX) 40 MG tablet TAKE 1 TABLET BY MOUTH DAILY 90 tablet 0   potassium citrate (UROCIT-K) 10 MEQ (1080 MG) SR tablet Take 10 mEq by mouth daily.     rosuvastatin (CRESTOR) 20 MG tablet Take 20 mg by mouth every evening.     vitamin C (ASCORBIC ACID) 250 MG tablet Take 500 mg by mouth daily.     No current facility-administered medications for this visit.    Allergies:   Amoxicillin, Azithromycin, and Penicillins    Social History:   reports that he has never smoked. He does not have any smokeless tobacco history on file. He reports that he does not drink alcohol and does not use drugs.   Family History:  family history includes Pneumonia in his father and mother.    ROS:     Review of Systems  Constitutional: Negative.   HENT: Negative.    Eyes: Negative.   Respiratory: Negative.  Cardiovascular: Negative.   Gastrointestinal: Negative.   Genitourinary: Negative.   Musculoskeletal:  Positive for joint pain.  Skin: Negative.   Neurological:  Positive for weakness.  Endo/Heme/Allergies: Negative.   Psychiatric/Behavioral: Negative.    All other systems reviewed and are negative.   All other systems are reviewed and negative.   PHYSICAL EXAM: VS:  BP 104/66   Pulse 65   Ht 5\' 8"  (1.727 m)   Wt 229 lb 3.2 oz (104 kg)   SpO2 93%   BMI 34.85 kg/m  , BMI Body mass index is 34.85 kg/m. Last weight:  Wt Readings from Last 3 Encounters:  01/05/23 229 lb 3.2 oz (104 kg)  09/04/22 217 lb (98.4 kg)  01/09/22 200 lb (90.7 kg)   Physical Exam Vitals reviewed.  Constitutional:      Appearance: Normal appearance. He  is normal weight.  HENT:     Head: Normocephalic.     Nose: Nose normal.     Mouth/Throat:     Mouth: Mucous membranes are moist.  Eyes:     Pupils: Pupils are equal, round, and reactive to light.  Cardiovascular:     Rate and Rhythm: Normal rate and regular rhythm.     Pulses: Normal pulses.     Heart sounds: Normal heart sounds.  Pulmonary:     Effort: Pulmonary effort is normal.  Abdominal:     General: Abdomen is flat. Bowel sounds are normal.  Musculoskeletal:        General: Normal range of motion.     Cervical back: Normal range of motion.  Skin:    General: Skin is warm.  Neurological:     General: No focal deficit present.     Mental Status: He is alert.  Psychiatric:        Mood and Affect: Mood normal.     EKG: none today  Recent Labs: No results found for requested labs within last 365 days.    Lipid Panel    Component Value Date/Time   CHOL 134 10/03/2015 1005   TRIG 69 10/03/2015 1005   HDL 54 10/03/2015 1005   CHOLHDL 2.5 10/03/2015 1005   LDLCALC 66 10/03/2015 1005      Other studies Reviewed: Patient: 563.0 - Jeffery Ward DOB:  [birth year]  Date:  01/28/2022 08:00 Provider: Adrian Blackwater MD Encounter: NUCLEAR STRESS TEST   Page 1 TESTS    [facility] [address] [place], [place] [address] [phone] STUDY:  Gated Stress / Rest Myocardial Perfusion Imaging Tomographic (SPECT) Including attenuation correction Wall Motion, Left Ventricular Ejection Fraction By Gated Technique.Persantine Stress Test. SEX: Male  WEIGHT: 204 lbs   HEIGHT: 68 in   ARMS UP: YES/NO  REFERRING PHYSICIAN: Dr.Saurav Crumble [place] Ward                                                                                                                                                                                                                        INDICATION FOR STUDY: CP                                                                                                                                                                                                                    TECHNIQUE:  Approximately 20 minutes following the intravenous administration of 9.6 mCi of Tc-60m Sestamibi after stress testing in a reclined supine position with arms above their head if able to do so, gated SPECT imaging of the heart was performed. After about a 2hr break, the patient was injected intravenously with 31.0 mCi of Tc-78m Sestamibi.  Approximately 45 minutes later in the same position as stress imaging SPECT rest imaging of the heart was performed.  STRESS BY:  Adrian Blackwater, MD PROTOCOL:  Persantine  DOSE ADMIN: 10 CC   ROUTE OF ADMINISTRATION: IV  MAX PRED HR: 134                     85%: 114               75%: 101                                                                                                                   RESTING BP: 120/68  RESTING HR: 50  PEAK BP: 118/64   PEAK HR: 57                                                                    EXERCISE DURATION:    4 min injection                                            REASON FOR TEST TERMINATION:    Protocol end                                                                                                                              SYMPTOMS:   None  EKG RESULTS:  Sinus bradycardia. 52/min. No significant ST changes with persantine.                                                             IMAGE QUALITY:  Good                                                                                                                                                                                                                                                                                                                                  PERFUSION/WALL MOTION FINDINGS:  EF = 69%. Small mild reversible  basal, mid and apical inferior wall, apex (17) wall defects. Normal wall motion.                                                                         IMPRESSION:  Equivocal stress test with normal LVEF, advise CCTA.  Adrian Blackwater, MD Stress Interpreting Physician / Nuclear Interpreting Physician        Adrian Blackwater MD  Electronically signed by: Adrian Blackwater     Date: 01/30/2022 10:04  Patient: 563.0 - Jeffery Ward DOB:  10/08/1932  Date:  04/28/2021 09:00 Provider: Adrian Blackwater MD Encounter: ECHO   Page 2 REASON FOR VISIT  Visit for: Echocardiogram/Shortness of breath  Sex:   Male         wt=  226  lbs.  BP= 124/72  Height=  67  inches.    TESTS  Imaging: Echocardiogram:  An echocardiogram in (2-d) mode was performed and in Doppler mode with color flow velocity mapping was performed. The aortic valve cusps are abnormal 1.6  cm, flow velocity 2.6   m/s, and systolic calculated mean flow gradient 13  mmHg. Mitral valve diastolic peak flow velocity E 0.6    m/s and E/A ratio 0.8. Aortic root diameter 3.7  cm. The LVOT internal diameter 2.4  cm and flow velocity was abnormal 0.9   m/s. LV systolic dimension 3.8  cm, diastolic 4.9   cm, posterior wall thickness 1.3  cm, fractional shortening 25  %, and EF 58  %. IVS thickness 1.2  cm. LA dimension 4.3 cm  RIGHT  atrium= 15.5   cm2. Mitral Valve =  Ea=9.3  DT= 401 msec. Tricuspid Valve =  TR jet V=   2.2   RAP=5  RVSP= 25    mmHg. Mitral Valve has Mild Regurgitation. Aortic Valve has Trace Regurgitation. Pulmonic Valve is Normal. Tricuspid Valve has Mild Regurgitation.   ASSESSMENT  Technically adequate study.  Ejection fraction-58  Left Ventricle- Normal size   Left Ventricle diastolic dysfunction grade-Grade 1 relaxation abnormality  Right Ventricle- Mildly dilated  Normal right ventricular wall motion  Left Atrium-Mildly dilated  Right Atrium-Normal size  Aortic valve-Mild Aortic valve calcification with Mild stenosis, Trace regurgitation  Pulmonic Valve-No stenosis, no regurgitation  Mitral Valve-Mild regurgitation  Tricuspid Valve-Mild Regurgitation  No Pericardial effusion.   THERAPY   Referring physician: Laurier Nancy  Sonographer: [email or web address].   Adrian Blackwater MD  Electronically signed by: Adrian Blackwater     Date: 04/28/2021 10:42  Patient: 563.0 - Erhardt "LEE" Harshman DOB:  [birth year]  Date:  04/28/2016 09:30 Provider: Adrian Blackwater MD Encounter: ALL ANGIOGRAMS (CTA BRAIN, CAROTIDS, RENAL ARTERIES, PE)   Page 2 REASON FOR VISIT  Referred by Adrian Blackwater.    TESTS  Imaging: Computed Tomographic Angiography:  Cardiac multidetector CT was performed paying particular attention to the coronary arteries for the diagnosis of: CAD. Diagnostic Drugs:  Administered iohexol (Omnipaque) through an antecubital vein and images from the examination were analyzed for the presence and extent of coronary artery disease, using 3D image processing software. 100 mL of non-ionic contrast (Omnipaque) was used.   ASSESSMENT   The left main artery was abnormal 1  The proximal LAD artery are abnormal 5  The mid-LAD artery was abnormal 2  The distal LAD artery was abnormal 1  The proximal circumflex artery was abnormal 2  The distal circumflex artery was abnormal 2 (calcified)  The  first obtuse marginal branch artery (OM-1) was abnormal 2  The proximal right coronary artery (RCA) was abnormal 2  The mid right coronary artery (RCA) was abnormal 2  The distal right coronary artery (RCA) was abnormal 2  The posterior descending coronary arteries were abnormal 2 (calcified)  The LIMA origin was visualized to LAD, patent  TEST CONCLUSIONS  1 - Calcium score is 9390.9.  2 - Right dominant system.  3 - LAD occluded proximally but LIMA fills retrogradely LAD.  RCA and LCX has no significant disease.  Treat medically.   Adrian Blackwater MD  Electronically signed by: Adrian Blackwater     Date: 04/30/2016 14:29   ASSESSMENT AND PLAN:    ICD-10-CM   1. Coronary artery disease involving native coronary artery of native heart without angina pectoris  I25.10     2. Primary hypertension  I10        Problem List Items Addressed This Visit       Cardiovascular and Mediastinum   CAD (coronary artery disease) - Primary    Patient feeling well. Denies chest pain, shortness of breath.       Primary hypertension    Well controlled. Denies dizziness.         Disposition:   Return in about 4 months (around 05/07/2023).    Total time spent: 30 minutes  Signed,  Adrian Blackwater, MD  01/05/2023 10:53 AM    [facility]

## 2023-01-05 NOTE — Assessment & Plan Note (Signed)
Patient feeling well. Denies chest pain, shortness of breath.

## 2023-01-07 ENCOUNTER — Ambulatory Visit: Payer: Medicare PPO | Admitting: Cardiovascular Disease

## 2023-01-25 ENCOUNTER — Other Ambulatory Visit: Payer: Self-pay | Admitting: Cardiovascular Disease

## 2023-02-02 ENCOUNTER — Other Ambulatory Visit: Payer: Self-pay | Admitting: Cardiovascular Disease

## 2023-02-03 ENCOUNTER — Other Ambulatory Visit: Payer: Self-pay | Admitting: Cardiovascular Disease

## 2023-02-09 ENCOUNTER — Telehealth: Payer: Self-pay | Admitting: Family Medicine

## 2023-02-09 ENCOUNTER — Other Ambulatory Visit: Payer: Self-pay | Admitting: Cardiovascular Disease

## 2023-02-09 NOTE — Telephone Encounter (Signed)
Patient's wife left VM that they have misplaced the new bottle of metoprolol and need it sent in to the pharmacy. They are aware that insurance will not cover it but he needs the medication. Please send.

## 2023-02-10 ENCOUNTER — Other Ambulatory Visit: Payer: Self-pay | Admitting: Cardiovascular Disease

## 2023-03-08 DIAGNOSIS — M150 Primary generalized (osteo)arthritis: Secondary | ICD-10-CM | POA: Insufficient documentation

## 2023-04-05 ENCOUNTER — Other Ambulatory Visit: Payer: Self-pay | Admitting: Cardiovascular Disease

## 2023-04-12 ENCOUNTER — Other Ambulatory Visit: Payer: Self-pay | Admitting: Cardiovascular Disease

## 2023-05-06 ENCOUNTER — Ambulatory Visit: Payer: Medicare PPO | Admitting: Cardiovascular Disease

## 2023-05-06 ENCOUNTER — Encounter: Payer: Self-pay | Admitting: Cardiovascular Disease

## 2023-05-06 VITALS — BP 120/75 | HR 65 | Ht 68.0 in | Wt 234.4 lb

## 2023-05-06 DIAGNOSIS — K219 Gastro-esophageal reflux disease without esophagitis: Secondary | ICD-10-CM

## 2023-05-06 DIAGNOSIS — I2510 Atherosclerotic heart disease of native coronary artery without angina pectoris: Secondary | ICD-10-CM | POA: Diagnosis not present

## 2023-05-06 DIAGNOSIS — I10 Essential (primary) hypertension: Secondary | ICD-10-CM

## 2023-05-06 DIAGNOSIS — Z951 Presence of aortocoronary bypass graft: Secondary | ICD-10-CM

## 2023-05-06 NOTE — Progress Notes (Signed)
Cardiology Office Note   Date:  05/06/2023   ID:  Jeffery Ward, DOB 08/21/1933, MRN [id number]  PCP:  Alease Medina, MD  Cardiologist:  Adrian Blackwater, MD      History of Present Illness: Jeffery Ward is a 89 y.o. male who presents for  Chief Complaint  Patient presents with   Follow-up    4 mo f/u    Break out of sweat      Past Medical History:  Diagnosis Date   Anxiety    Arthritis    back   Balanitis    BPH without obstruction/lower urinary tract symptoms    Coronary artery arteriosclerosis    Erectile dysfunction    GERD (gastroesophageal reflux disease)    Hearing difficulty    Hematuria    Hypogonadism in male    Kidney stones    Microscopic hematuria    Obesity    Urinary urgency      Past Surgical History:  Procedure Laterality Date   CORONARY ARTERY BYPASS GRAFT  2011   CYSTOSCOPY WITH HOLMIUM LASER LITHOTRIPSY  12/09/2021   Procedure: CYSTOSCOPY WITH HOLMIUM LASER LITHOTRIPSY;  Surgeon: Riki Altes, MD;  Location: [facility];  Service: Urology;;   CYSTOSCOPY WITH LITHOLAPAXY N/A 12/09/2021   Procedure: CYSTOSCOPY WITH LITHOLAPAXY;  Surgeon: Riki Altes, MD;  Location: [facility];  Service: Urology;  Laterality: N/A;   CYSTOSCOPY WITH STENT PLACEMENT Left 11/11/2021   Procedure: CYSTOSCOPY WITH STENT PLACEMENT;  Surgeon: Riki Altes, MD;  Location: [facility];  Service: Urology;  Laterality: Left;   CYSTOSCOPY/RETROGRADE/URETEROSCOPY/STONE EXTRACTION WITH BASKET  12/09/2021   Procedure: CYSTOSCOPY/RETROGRADE/URETEROSCOPY/STONE EXTRACTION WITH BASKET;  Surgeon: Riki Altes, MD;  Location: [facility];  Service: Urology;;   LITHOTRIPSY       Current Outpatient Medications  Medication Sig Dispense Refill   aspirin 81 MG EC tablet Take 81 mg by mouth daily. Swallow whole.     clopidogrel (PLAVIX) 75 MG tablet TAKE ONE TABLET EVERY DAY 90 tablet 1   Coenzyme Q10 (COQ10) 200 MG CAPS Take 200 mg by mouth daily.     ferrous sulfate 324  MG TBEC Take 324 mg by mouth at bedtime.     fluticasone (FLONASE) 50 MCG/ACT nasal spray Place 1 spray into both nostrils daily.     metoprolol succinate (TOPROL-XL) 25 MG 24 hr tablet TAKE ONE TABLET BY MOUTH EVERY DAY 90 tablet 0   pantoprazole (PROTONIX) 40 MG tablet TAKE 1 TABLET BY MOUTH DAILY 90 tablet 0   potassium citrate (UROCIT-K) 10 MEQ (1080 MG) SR tablet Take 10 mEq by mouth daily.     rosuvastatin (CRESTOR) 40 MG tablet TAKE 1 TABLET BY MOUTH DAILY 90 tablet 1   vitamin C (ASCORBIC ACID) 250 MG tablet Take 500 mg by mouth daily.     No current facility-administered medications for this visit.    Allergies:   Amoxicillin, Azithromycin, and Penicillins    Social History:   reports that he has never smoked. He does not have any smokeless tobacco history on file. He reports that he does not drink alcohol and does not use drugs.   Family History:  family history includes Pneumonia in his father and mother.    ROS:     Review of Systems  Constitutional: Negative.   HENT: Negative.    Eyes: Negative.   Respiratory: Negative.    Gastrointestinal: Negative.   Genitourinary: Negative.   Musculoskeletal: Negative.   Skin:  Negative.   Neurological: Negative.   Endo/Heme/Allergies: Negative.   Psychiatric/Behavioral: Negative.    All other systems reviewed and are negative.     All other systems are reviewed and negative.    PHYSICAL EXAM: VS:  BP 120/75   Pulse 65   Ht 5\' 8"  (1.727 m)   Wt 234 lb 6.4 oz (106.3 kg)   SpO2 95%   BMI 35.64 kg/m  , BMI Body mass index is 35.64 kg/m. Last weight:  Wt Readings from Last 3 Encounters:  05/06/23 234 lb 6.4 oz (106.3 kg)  01/05/23 229 lb 3.2 oz (104 kg)  09/04/22 217 lb (98.4 kg)     Physical Exam Vitals reviewed.  Constitutional:      Appearance: Normal appearance. He is normal weight.  HENT:     Head: Normocephalic.     Nose: Nose normal.     Mouth/Throat:     Mouth: Mucous membranes are moist.  Eyes:      Pupils: Pupils are equal, round, and reactive to light.  Cardiovascular:     Rate and Rhythm: Normal rate and regular rhythm.     Pulses: Normal pulses.     Heart sounds: Normal heart sounds.  Pulmonary:     Effort: Pulmonary effort is normal.  Abdominal:     General: Abdomen is flat. Bowel sounds are normal.  Musculoskeletal:        General: Normal range of motion.     Cervical back: Normal range of motion.  Skin:    General: Skin is warm.  Neurological:     General: No focal deficit present.     Mental Status: He is alert.  Psychiatric:        Mood and Affect: Mood normal.       EKG:   Recent Labs: No results found for requested labs within last 365 days.    Lipid Panel    Component Value Date/Time   CHOL 134 10/03/2015 1005   TRIG 69 10/03/2015 1005   HDL 54 10/03/2015 1005   CHOLHDL 2.5 10/03/2015 1005   LDLCALC 66 10/03/2015 1005      Other studies Reviewed: Additional studies/ records that were reviewed today include:  Review of the above records demonstrates:       No data to display            ASSESSMENT AND PLAN:    ICD-10-CM   1. Coronary artery disease involving native coronary artery of native heart without angina pectoris  I25.10 MYOCARDIAL PERFUSION IMAGING   breaks out of sweat    2. Primary hypertension  I10 MYOCARDIAL PERFUSION IMAGING   stable    3. Gastroesophageal reflux disease, unspecified whether esophagitis present  K21.9 MYOCARDIAL PERFUSION IMAGING    4. Hx of CABG  Z95.1 MYOCARDIAL PERFUSION IMAGING       Problem List Items Addressed This Visit       Cardiovascular and Mediastinum   CAD (coronary artery disease) - Primary   Relevant Orders   MYOCARDIAL PERFUSION IMAGING   Primary hypertension   Relevant Orders   MYOCARDIAL PERFUSION IMAGING     Digestive   GERD (gastroesophageal reflux disease)   Relevant Orders   MYOCARDIAL PERFUSION IMAGING     Other   Hx of CABG   Relevant Orders   MYOCARDIAL  PERFUSION IMAGING       Disposition:   Return in about 2 weeks (around 05/20/2023) for set up stress test and f/u.  Total time spent: 30 minutes  Signed,  Adrian Blackwater, MD  05/06/2023 10:09 AM    [facility]

## 2023-05-13 ENCOUNTER — Ambulatory Visit (INDEPENDENT_AMBULATORY_CARE_PROVIDER_SITE_OTHER): Payer: Medicare PPO

## 2023-05-13 DIAGNOSIS — K219 Gastro-esophageal reflux disease without esophagitis: Secondary | ICD-10-CM

## 2023-05-13 DIAGNOSIS — I10 Essential (primary) hypertension: Secondary | ICD-10-CM

## 2023-05-13 DIAGNOSIS — Z951 Presence of aortocoronary bypass graft: Secondary | ICD-10-CM | POA: Diagnosis not present

## 2023-05-13 DIAGNOSIS — I2510 Atherosclerotic heart disease of native coronary artery without angina pectoris: Secondary | ICD-10-CM | POA: Diagnosis not present

## 2023-05-13 MED ORDER — TECHNETIUM TC 99M SESTAMIBI GENERIC - CARDIOLITE
10.8000 | Freq: Once | INTRAVENOUS | Status: AC | PRN
  Administered 2023-05-13: 10.8 via INTRAVENOUS

## 2023-05-13 MED ORDER — TECHNETIUM TC 99M SESTAMIBI GENERIC - CARDIOLITE
31.4000 | Freq: Once | INTRAVENOUS | Status: AC | PRN
  Administered 2023-05-13: 31.4 via INTRAVENOUS

## 2023-05-20 ENCOUNTER — Encounter: Payer: Self-pay | Admitting: Cardiovascular Disease

## 2023-05-20 ENCOUNTER — Ambulatory Visit: Payer: Medicare PPO | Admitting: Cardiovascular Disease

## 2023-05-20 VITALS — BP 137/70 | HR 56 | Ht 68.0 in | Wt 230.2 lb

## 2023-05-20 DIAGNOSIS — I10 Essential (primary) hypertension: Secondary | ICD-10-CM

## 2023-05-20 DIAGNOSIS — I2510 Atherosclerotic heart disease of native coronary artery without angina pectoris: Secondary | ICD-10-CM | POA: Diagnosis not present

## 2023-05-20 DIAGNOSIS — Z951 Presence of aortocoronary bypass graft: Secondary | ICD-10-CM | POA: Diagnosis not present

## 2023-05-20 DIAGNOSIS — K219 Gastro-esophageal reflux disease without esophagitis: Secondary | ICD-10-CM

## 2023-05-20 NOTE — Progress Notes (Signed)
Cardiology Office Note   Date:  05/20/2023   ID:  Jeffery Ward, DOB [birth year], MRN [id number]  PCP:  Alease Medina, MD  Cardiologist:  Adrian Blackwater, MD      History of Present Illness: Jeffery Ward is a 89 y.o. male who presents for  Chief Complaint  Patient presents with   Results    HPI    Past Medical History:  Diagnosis Date   Anxiety    Arthritis    back   Balanitis    BPH without obstruction/lower urinary tract symptoms    Coronary artery arteriosclerosis    Erectile dysfunction    GERD (gastroesophageal reflux disease)    Hearing difficulty    Hematuria    Hypogonadism in male    Kidney stones    Microscopic hematuria    Obesity    Urinary urgency      Past Surgical History:  Procedure Laterality Date   CORONARY ARTERY BYPASS GRAFT  2011   CYSTOSCOPY WITH HOLMIUM LASER LITHOTRIPSY  12/09/2021   Procedure: CYSTOSCOPY WITH HOLMIUM LASER LITHOTRIPSY;  Surgeon: Riki Altes, MD;  Location: [facility];  Service: Urology;;   CYSTOSCOPY WITH LITHOLAPAXY N/A 12/09/2021   Procedure: CYSTOSCOPY WITH LITHOLAPAXY;  Surgeon: Riki Altes, MD;  Location: [facility];  Service: Urology;  Laterality: N/A;   CYSTOSCOPY WITH STENT PLACEMENT Left 11/11/2021   Procedure: CYSTOSCOPY WITH STENT PLACEMENT;  Surgeon: Riki Altes, MD;  Location: [facility];  Service: Urology;  Laterality: Left;   CYSTOSCOPY/RETROGRADE/URETEROSCOPY/STONE EXTRACTION WITH BASKET  12/09/2021   Procedure: CYSTOSCOPY/RETROGRADE/URETEROSCOPY/STONE EXTRACTION WITH BASKET;  Surgeon: Riki Altes, MD;  Location: [facility];  Service: Urology;;   LITHOTRIPSY       Current Outpatient Medications  Medication Sig Dispense Refill   aspirin 81 MG EC tablet Take 81 mg by mouth daily. Swallow whole.     clopidogrel (PLAVIX) 75 MG tablet TAKE ONE TABLET EVERY DAY 90 tablet 1   Coenzyme Q10 (COQ10) 200 MG CAPS Take 200 mg by mouth daily.     ferrous sulfate 324 MG TBEC Take 324 mg by mouth at  bedtime.     fluticasone (FLONASE) 50 MCG/ACT nasal spray Place 1 spray into both nostrils daily.     metoprolol succinate (TOPROL-XL) 25 MG 24 hr tablet TAKE ONE TABLET BY MOUTH EVERY DAY 90 tablet 0   pantoprazole (PROTONIX) 40 MG tablet TAKE 1 TABLET BY MOUTH DAILY 90 tablet 0   potassium citrate (UROCIT-K) 10 MEQ (1080 MG) SR tablet Take 10 mEq by mouth daily.     rosuvastatin (CRESTOR) 40 MG tablet TAKE 1 TABLET BY MOUTH DAILY 90 tablet 1   vitamin C (ASCORBIC ACID) 250 MG tablet Take 500 mg by mouth daily.     No current facility-administered medications for this visit.    Allergies:   Amoxicillin, Azithromycin, and Penicillins    Social History:   reports that he has never smoked. He does not have any smokeless tobacco history on file. He reports that he does not drink alcohol and does not use drugs.   Family History:  family history includes Pneumonia in his father and mother.    ROS:     ROS    All other systems are reviewed and negative.    PHYSICAL EXAM: VS:  BP 137/70   Pulse (!) 56   Ht 5\' 8"  (1.727 m)   Wt 230 lb 3.2 oz (104.4 kg)   SpO2 97%  BMI 35.00 kg/m  , BMI Body mass index is 35 kg/m. Last weight:  Wt Readings from Last 3 Encounters:  05/20/23 230 lb 3.2 oz (104.4 kg)  05/06/23 234 lb 6.4 oz (106.3 kg)  01/05/23 229 lb 3.2 oz (104 kg)     Physical Exam    EKG:   Recent Labs: No results found for requested labs within last 365 days.    Lipid Panel    Component Value Date/Time   CHOL 134 10/03/2015 1005   TRIG 69 10/03/2015 1005   HDL 54 10/03/2015 1005   CHOLHDL 2.5 10/03/2015 1005   LDLCALC 66 10/03/2015 1005      Other studies Reviewed: Additional studies/ records that were reviewed today include:  Review of the above records demonstrates:       No data to display            ASSESSMENT AND PLAN:    ICD-10-CM   1. Hx of CABG  Z95.1    strress test no ischaemia, LVEF 71%. Normal.    2. Coronary artery disease  involving native coronary artery of native heart without angina pectoris  I25.10     3. Primary hypertension  I10     4. Gastroesophageal reflux disease, unspecified whether esophagitis present  K21.9        Problem List Items Addressed This Visit       Cardiovascular and Mediastinum   CAD (coronary artery disease)   Primary hypertension     Digestive   GERD (gastroesophageal reflux disease)     Other   Hx of CABG - Primary       Disposition:   Return in about 3 months (around 08/20/2023).    Total time spent: 35 minutes  Signed,  Adrian Blackwater, MD  05/20/2023 11:20 AM    [facility]

## 2023-05-26 DIAGNOSIS — M25512 Pain in left shoulder: Secondary | ICD-10-CM | POA: Insufficient documentation

## 2023-05-26 DIAGNOSIS — M19012 Primary osteoarthritis, left shoulder: Secondary | ICD-10-CM | POA: Insufficient documentation

## 2023-05-26 DIAGNOSIS — M25561 Pain in right knee: Secondary | ICD-10-CM | POA: Insufficient documentation

## 2023-05-26 DIAGNOSIS — M1711 Unilateral primary osteoarthritis, right knee: Secondary | ICD-10-CM | POA: Insufficient documentation

## 2023-08-26 ENCOUNTER — Ambulatory Visit: Payer: Medicare PPO | Admitting: Cardiovascular Disease

## 2023-09-08 ENCOUNTER — Ambulatory Visit
Admission: RE | Admit: 2023-09-08 | Discharge: 2023-09-08 | Disposition: A | Discharge status: Home / Self Care | Payer: Medicare PPO | Attending: Urology | Admitting: Urology

## 2023-09-08 ENCOUNTER — Other Ambulatory Visit: Payer: Self-pay | Admitting: *Deleted

## 2023-09-08 ENCOUNTER — Ambulatory Visit: Payer: Medicare PPO | Admitting: Urology

## 2023-09-08 ENCOUNTER — Ambulatory Visit
Admission: RE | Admit: 2023-09-08 | Discharge: 2023-09-08 | Disposition: A | Discharge status: Home / Self Care | Payer: Medicare PPO | Source: Ambulatory Visit | Attending: Urology | Admitting: Urology

## 2023-09-08 ENCOUNTER — Encounter: Payer: Self-pay | Admitting: Urology

## 2023-09-08 VITALS — BP 156/76 | HR 57

## 2023-09-08 DIAGNOSIS — N201 Calculus of ureter: Secondary | ICD-10-CM | POA: Insufficient documentation

## 2023-09-08 DIAGNOSIS — N200 Calculus of kidney: Secondary | ICD-10-CM

## 2023-09-08 LAB — MICROSCOPIC EXAMINATION

## 2023-09-08 LAB — URINALYSIS, COMPLETE
Bilirubin, UA: NEGATIVE
Glucose, UA: NEGATIVE
Leukocytes,UA: NEGATIVE
Nitrite, UA: NEGATIVE
RBC, UA: NEGATIVE
Specific Gravity, UA: >1.03 — ABNORMAL HIGH (ref 1.005–1.030)
Urobilinogen, Ur: 0.2 mg/dL (ref 0.2–1.0)
pH, UA: 5.5 (ref 5.0–7.5)

## 2023-09-08 NOTE — Progress Notes (Signed)
I,Amy L Pierron,acting as a scribe for Riki Altes, MD.,have documented all relevant documentation on the behalf of Riki Altes, MD,as directed by  Riki Altes, MD while in the presence of Riki Altes, MD.  09/08/2023 7:00 PM   Jeffery Ward 1933/02/20 [id number]  Referring provider: [facility] [address] [place],  [place] [address]  Chief Complaint  Patient presents with   Follow-up   Nephrolithiasis    Urologic history: 1.  Urolithiasis Urgent left ureteral stent placement 11/11/2021 for 3 mm obstructing distal ureteral calculus with sepsis Follow-up ureteroscopy December 17, 2021; distal calculus had passed.  A 13 mm bladder calculus and 5 mm renal calculus were treated Prior calculus treated with SWL several years ago  HPI: 89 y.o. male presents for annual follow-up accompanied by his daughter.  Approximately 2 weeks ago has been complaining of left low back which he states is similar to previous stone episodes but not as severe. No bothersome LUTS and denies dysuria or gross hematuria. KUB today reviewed and noted calcifications identified overlying the renal outlines or expected course of the ureter.  PMH: Past Medical History:  Diagnosis Date   Anxiety    Arthritis    back   Balanitis    BPH without obstruction/lower urinary tract symptoms    Coronary artery arteriosclerosis    Erectile dysfunction    GERD (gastroesophageal reflux disease)    Hearing difficulty    Hematuria    Hypogonadism in male    Kidney stones    Microscopic hematuria    Obesity    Urinary urgency     Surgical History: Past Surgical History:  Procedure Laterality Date   CORONARY ARTERY BYPASS GRAFT  2011   CYSTOSCOPY WITH HOLMIUM LASER LITHOTRIPSY  2021-12-17   Procedure: CYSTOSCOPY WITH HOLMIUM LASER LITHOTRIPSY;  Surgeon: Riki Altes, MD;  Location: [facility];  Service: Urology;;   CYSTOSCOPY WITH LITHOLAPAXY N/A December 17, 2021   Procedure: CYSTOSCOPY WITH  LITHOLAPAXY;  Surgeon: Riki Altes, MD;  Location: [facility];  Service: Urology;  Laterality: N/A;   CYSTOSCOPY WITH STENT PLACEMENT Left 11/11/2021   Procedure: CYSTOSCOPY WITH STENT PLACEMENT;  Surgeon: Riki Altes, MD;  Location: [facility];  Service: Urology;  Laterality: Left;   CYSTOSCOPY/RETROGRADE/URETEROSCOPY/STONE EXTRACTION WITH BASKET  Dec 17, 2021   Procedure: CYSTOSCOPY/RETROGRADE/URETEROSCOPY/STONE EXTRACTION WITH BASKET;  Surgeon: Riki Altes, MD;  Location: [facility];  Service: Urology;;   LITHOTRIPSY      Home Medications:  Allergies as of 09/08/2023       Reactions   Amoxicillin Other (See Comments)   Azithromycin Diarrhea   Penicillins Other (See Comments)   Quinapril Other (See Comments)        Medication List        Accurate as of September 08, 2023  7:00 PM. If you have any questions, ask your nurse or doctor.          amLODipine 5 MG tablet Commonly known as: NORVASC Take 5 mg by mouth daily.   aspirin EC 81 MG tablet Take 81 mg by mouth daily. Swallow whole.   clopidogrel 75 MG tablet Commonly known as: PLAVIX TAKE ONE TABLET EVERY DAY   CoQ10 200 MG Caps Take 200 mg by mouth daily.   ferrous sulfate 324 MG Tbec Take 324 mg by mouth at bedtime.   fluticasone 50 MCG/ACT nasal spray Commonly known as: FLONASE Place 1 spray into both nostrils daily.   metoprolol succinate 25 MG 24 hr  tablet Commonly known as: TOPROL-XL TAKE ONE TABLET BY MOUTH EVERY DAY   pantoprazole 40 MG tablet Commonly known as: PROTONIX TAKE 1 TABLET BY MOUTH DAILY   potassium citrate 10 MEQ (1080 MG) SR tablet Commonly known as: UROCIT-K Take 10 mEq by mouth daily.   rosuvastatin 40 MG tablet Commonly known as: CRESTOR TAKE 1 TABLET BY MOUTH DAILY   vitamin C 250 MG tablet Commonly known as: ASCORBIC ACID Take 500 mg by mouth daily.        Allergies:  Allergies  Allergen Reactions   Amoxicillin Other (See Comments)   Azithromycin Diarrhea    Penicillins Other (See Comments)   Quinapril Other (See Comments)    Family History: Family History  Problem Relation Age of Onset   Pneumonia Mother    Pneumonia Father    Kidney disease Neg Hx    Prostate cancer Neg Hx    Bladder Cancer Neg Hx     Social History:  reports that he has never smoked. He does not have any smokeless tobacco history on file. He reports that he does not drink alcohol and does not use drugs.   Physical Exam: BP (!) 156/76   Pulse (!) 57   Constitutional:  Alert and oriented, No acute distress. Respiratory: Normal respiratory effort, no increased work of breathing. Psychiatric: Normal mood and affect.   Pertinent Imaging: KUB images personally reviewed and interpreted. Awaiting final radiologic interpretation.   Assessment & Plan:    Recurrent nephrolithiasis Recent episode of left low back pain, which is most likely musculoskeletal in etiology.  UA ordered today. If he has microhematuria, will obtain renal stone CT.  If UA negative and he is having persistent back pain, he will call and we'll schedule CT.  Continue annual follow-up.  [facility] [address] [place], [place] [address] [phone]

## 2023-09-10 NOTE — Progress Notes (Signed)
Notified patient as instructed, patient will call back if back continues

## 2023-09-27 ENCOUNTER — Ambulatory Visit: Payer: Medicare PPO | Admitting: Cardiovascular Disease

## 2023-10-01 ENCOUNTER — Ambulatory Visit: Payer: Medicare PPO | Admitting: Cardiovascular Disease

## 2023-10-01 ENCOUNTER — Encounter: Payer: Self-pay | Admitting: Cardiovascular Disease

## 2023-10-01 VITALS — BP 122/84 | HR 56 | Ht 68.0 in | Wt 224.3 lb

## 2023-10-01 DIAGNOSIS — I10 Essential (primary) hypertension: Secondary | ICD-10-CM

## 2023-10-01 DIAGNOSIS — R55 Syncope and collapse: Secondary | ICD-10-CM

## 2023-10-01 DIAGNOSIS — K219 Gastro-esophageal reflux disease without esophagitis: Secondary | ICD-10-CM | POA: Diagnosis not present

## 2023-10-01 DIAGNOSIS — I2510 Atherosclerotic heart disease of native coronary artery without angina pectoris: Secondary | ICD-10-CM | POA: Diagnosis not present

## 2023-10-01 DIAGNOSIS — Z951 Presence of aortocoronary bypass graft: Secondary | ICD-10-CM

## 2023-10-01 NOTE — Progress Notes (Signed)
 Cardiology Office Note   Date:  10/01/2023   ID:  Jeffery Ward, DOB 1932/12/18, MRN [id number]  PCP:  Ziglar, Susan K, MD  Cardiologist:  Denyse Bathe, MD      History of Present Illness: Jeffery Ward is a 89 y.o. male who presents for  Chief Complaint  Patient presents with   Follow-up    3 month follow up    Had concussion and fell hit back of head.      Past Medical History:  Diagnosis Date   Anxiety    Arthritis    back   Balanitis    BPH without obstruction/lower urinary tract symptoms    Coronary artery arteriosclerosis    Erectile dysfunction    GERD (gastroesophageal reflux disease)    Hearing difficulty    Hematuria    Hypogonadism in male    Kidney stones    Microscopic hematuria    Obesity    Urinary urgency      Past Surgical History:  Procedure Laterality Date   CORONARY ARTERY BYPASS GRAFT  2011   CYSTOSCOPY WITH HOLMIUM LASER LITHOTRIPSY  12/09/2021   Procedure: CYSTOSCOPY WITH HOLMIUM LASER LITHOTRIPSY;  Surgeon: Twylla Glendia BROCKS, MD;  Location: [facility];  Service: Urology;;   CYSTOSCOPY WITH LITHOLAPAXY N/A 12/09/2021   Procedure: CYSTOSCOPY WITH LITHOLAPAXY;  Surgeon: Twylla Glendia BROCKS, MD;  Location: [facility];  Service: Urology;  Laterality: N/A;   CYSTOSCOPY WITH STENT PLACEMENT Left 11/11/2021   Procedure: CYSTOSCOPY WITH STENT PLACEMENT;  Surgeon: Twylla Glendia BROCKS, MD;  Location: [facility];  Service: Urology;  Laterality: Left;   CYSTOSCOPY/RETROGRADE/URETEROSCOPY/STONE EXTRACTION WITH BASKET  12/09/2021   Procedure: CYSTOSCOPY/RETROGRADE/URETEROSCOPY/STONE EXTRACTION WITH BASKET;  Surgeon: Twylla Glendia BROCKS, MD;  Location: [facility];  Service: Urology;;   LITHOTRIPSY       Current Outpatient Medications  Medication Sig Dispense Refill   amLODipine  (NORVASC ) 5 MG tablet Take 5 mg by mouth daily.     aspirin  81 MG EC tablet Take 81 mg by mouth daily. Swallow whole.     clopidogrel  (PLAVIX ) 75 MG tablet TAKE ONE TABLET EVERY DAY 90  tablet 1   Coenzyme Q10 (COQ10) 200 MG CAPS Take 200 mg by mouth daily.     ferrous sulfate  324 MG TBEC Take 324 mg by mouth at bedtime.     fluticasone  (FLONASE ) 50 MCG/ACT nasal spray Place 1 spray into both nostrils daily.     metoprolol  succinate (TOPROL -XL) 25 MG 24 hr tablet TAKE ONE TABLET BY MOUTH EVERY DAY 90 tablet 0   pantoprazole  (PROTONIX ) 40 MG tablet TAKE 1 TABLET BY MOUTH DAILY 90 tablet 0   potassium citrate  (UROCIT-K ) 10 MEQ (1080 MG) SR tablet Take 10 mEq by mouth daily.     rosuvastatin  (CRESTOR ) 40 MG tablet TAKE 1 TABLET BY MOUTH DAILY 90 tablet 1   vitamin C (ASCORBIC ACID ) 250 MG tablet Take 500 mg by mouth daily.     No current facility-administered medications for this visit.    Allergies:   Amoxicillin, Azithromycin, Penicillins, and Quinapril    Social History:   reports that he has never smoked. He does not have any smokeless tobacco history on file. He reports that he does not drink alcohol and does not use drugs.   Family History:  family history includes Pneumonia in his father and mother.    ROS:     Review of Systems  Constitutional: Negative.   HENT: Negative.    Eyes:  Negative.   Respiratory: Negative.    Gastrointestinal: Negative.   Genitourinary: Negative.   Musculoskeletal: Negative.   Skin: Negative.   Neurological: Negative.   Endo/Heme/Allergies: Negative.   Psychiatric/Behavioral: Negative.    All other systems reviewed and are negative.     All other systems are reviewed and negative.    PHYSICAL EXAM: VS:  BP 122/84   Pulse (!) 56   Ht 5' 8 (1.727 m)   Wt 224 lb 4.8 oz (101.7 kg)   SpO2 98%   BMI 34.10 kg/m  , BMI Body mass index is 34.1 kg/m. Last weight:  Wt Readings from Last 3 Encounters:  10/01/23 224 lb 4.8 oz (101.7 kg)  05/20/23 230 lb 3.2 oz (104.4 kg)  05/06/23 234 lb 6.4 oz (106.3 kg)     Physical Exam Vitals reviewed.  Constitutional:      Appearance: Normal appearance. He is normal weight.  HENT:      Head: Normocephalic.     Nose: Nose normal.     Mouth/Throat:     Mouth: Mucous membranes are moist.  Eyes:     Pupils: Pupils are equal, round, and reactive to light.  Cardiovascular:     Rate and Rhythm: Normal rate and regular rhythm.     Pulses: Normal pulses.     Heart sounds: Normal heart sounds.  Pulmonary:     Effort: Pulmonary effort is normal.  Abdominal:     General: Abdomen is flat. Bowel sounds are normal.  Musculoskeletal:        General: Normal range of motion.     Cervical back: Normal range of motion.  Skin:    General: Skin is warm.  Neurological:     General: No focal deficit present.     Mental Status: He is alert.  Psychiatric:        Mood and Affect: Mood normal.       EKG:   Recent Labs: No results found for requested labs within last 365 days.    Lipid Panel    Component Value Date/Time   CHOL 134 10/03/2015 1005   TRIG 69 10/03/2015 1005   HDL 54 10/03/2015 1005   CHOLHDL 2.5 10/03/2015 1005   LDLCALC 66 10/03/2015 1005      Other studies Reviewed: Additional studies/ records that were reviewed today include:  Review of the above records demonstrates:       No data to display            ASSESSMENT AND PLAN:    ICD-10-CM   1. Coronary artery disease involving native coronary artery of native heart without angina pectoris  I25.10    Denies any chest pain.    2. Primary hypertension  I10    Blood pressure is stable.    3. Gastroesophageal reflux disease, unspecified whether esophagitis present  K21.9     4. Hx of CABG  Z95.1    Denies any chest pain or shortness of breath and recent stress test last year was unremarkable.    5. Syncope, unspecified syncope type  R55    Patient had a episode where he fell out and hit his head and has been having this balance issue for a while.  But appears to be in sinus rhythm.       Problem List Items Addressed This Visit       Cardiovascular and Mediastinum   CAD (coronary  artery disease) - Primary   Primary hypertension  Digestive   GERD (gastroesophageal reflux disease)     Other   Hx of CABG   Other Visit Diagnoses       Syncope, unspecified syncope type       Patient had a episode where he fell out and hit his head and has been having this balance issue for a while.  But appears to be in sinus rhythm.          Disposition:   Return in about 3 months (around 12/30/2023).    Total time spent: 30 minutes  Signed,  Denyse Bathe, MD  10/01/2023 11:43 AM    [facility]

## 2023-10-05 ENCOUNTER — Telehealth: Payer: Self-pay | Admitting: Family Medicine

## 2023-10-05 ENCOUNTER — Other Ambulatory Visit: Payer: Self-pay

## 2023-10-05 MED ORDER — POTASSIUM CITRATE ER 10 MEQ (1080 MG) PO TBCR: 10.0000 meq | EXTENDED_RELEASE_TABLET | Freq: Every day | ORAL | 3 refills | Status: DC

## 2023-10-05 NOTE — Telephone Encounter (Signed)
 PT needs potassium citrate (UROCIT-K10) refilled per wife to [facility]

## 2023-10-12 ENCOUNTER — Ambulatory Visit (INDEPENDENT_AMBULATORY_CARE_PROVIDER_SITE_OTHER): Payer: Medicare PPO | Admitting: Family Medicine

## 2023-10-12 ENCOUNTER — Encounter: Payer: Self-pay | Admitting: Family Medicine

## 2023-10-12 VITALS — BP 146/80 | HR 68 | Temp 98.0°F | Resp 18 | Ht 67.0 in | Wt 234.0 lb

## 2023-10-12 DIAGNOSIS — E119 Type 2 diabetes mellitus without complications: Secondary | ICD-10-CM | POA: Diagnosis not present

## 2023-10-12 DIAGNOSIS — E538 Deficiency of other specified B group vitamins: Secondary | ICD-10-CM | POA: Insufficient documentation

## 2023-10-12 DIAGNOSIS — I2510 Atherosclerotic heart disease of native coronary artery without angina pectoris: Secondary | ICD-10-CM

## 2023-10-12 DIAGNOSIS — E118 Type 2 diabetes mellitus with unspecified complications: Secondary | ICD-10-CM | POA: Diagnosis not present

## 2023-10-12 DIAGNOSIS — Z23 Encounter for immunization: Secondary | ICD-10-CM | POA: Diagnosis not present

## 2023-10-12 DIAGNOSIS — I10 Essential (primary) hypertension: Secondary | ICD-10-CM

## 2023-10-12 MED ORDER — CYANOCOBALAMIN 1000 MCG/ML IJ SOLN
1000.0000 ug | Freq: Once | INTRAMUSCULAR | Status: AC
  Administered 2023-10-12: 1000 ug via INTRAMUSCULAR

## 2023-10-12 NOTE — Assessment & Plan Note (Signed)
Currently on no medications for diabetes diet controlled.  Checking his A1c

## 2023-10-12 NOTE — Progress Notes (Signed)
   Established Patient Office Visit  Subjective   Patient ID: Jeffery Ward, male    DOB: 1932/11/05  Age: 89 y.o. MRN: [id number]  Chief Complaint  Patient presents with   Establish Care   Medical Management of Chronic Issues    HPI Jeffery Ward is doing pretty well except he cannot hear very well and he is not wearing his hearing aids.  Saw the cardiologist Dr. Welton Flakes, who told him he is doing quite well.  Had a stress test a year ago.  No change in his medications. Saw urology back in November.  Had a KUB that revealed no calcifications identified overlying the renal outlines or the expected course of the ureters.   He complains of having little to no energy and would like testosterone supplementation.  He used to take testosterone and noticed that he had a lot more energy than.  Advised that testosterone supplementation in his age group is dangerous.  He is gotten a walker with a seat so that he can exercise a little bit more.  He has [place] walking at the mall because it so cold outside.   His blood sugar this morning was 123. PHQ-9 is 10   ROS    Objective:     BP (!) 146/80 (BP Location: Left Arm, Patient Position: Sitting, Cuff Size: Normal)   Pulse 68   Temp 98 F (36.7 C) (Oral)   Resp 18   Ht 5\' 7"  (1.702 m) Comment: per patient  Wt 234 lb (106.1 kg)   SpO2 96%   BMI 36.65 kg/m    Physical Exam    Diabetic foot exam was performed with the following findings:   No deformities, ulcerations, or other skin breakdown Normal sensation of 10g monofilament Intact posterior tibialis and dorsalis pedis pulses Multiple hammertoes bilaterally      No results found for any visits on 10/12/23.    The ASCVD Risk score (Arnett DK, et al., 2019) failed to calculate for the following reasons:   The 2019 ASCVD risk score is only valid for ages 20 to 57    Assessment & Plan:  Need for immunization against influenza -     Flu Vaccine Trivalent High Dose (Fluad)  B12  deficiency Assessment & Plan: Checking his B12 level then we will give him a B12 injection.  He is complaining of weakness  Orders: -     B12 and Folate Panel -     Cyanocobalamin  Type 2 diabetes mellitus without complication, without long-term current use of insulin (HCC) -     Hemoglobin A1c -     CBC with Differential/Platelet -     CMP14+EGFR -     Lipid panel -     HM Diabetes Foot Exam -     Microalbumin / creatinine urine ratio  Controlled type 2 diabetes mellitus with complication, without long-term current use of insulin (HCC) Assessment & Plan: Currently on no medications for diabetes diet controlled.  Checking his A1c   Coronary artery disease involving native coronary artery of native heart without angina pectoris Assessment & Plan: Just had his annual cardiology visit no change in medication.  Had a negative stress test last year.   Primary hypertension Assessment & Plan: On metoprolol 1/2 tablet daily      Return in about 3 months (around 01/10/2024).    Alease Medina, MD

## 2023-10-12 NOTE — Assessment & Plan Note (Signed)
Checking his B12 level then we will give him a B12 injection.  He is complaining of weakness

## 2023-10-12 NOTE — Assessment & Plan Note (Signed)
On metoprolol 1/2 tablet daily

## 2023-10-12 NOTE — Assessment & Plan Note (Signed)
Just had his annual cardiology visit no change in medication.  Had a negative stress test last year.

## 2023-10-13 LAB — CMP14+EGFR
ALT: 20 [IU]/L (ref 0–44)
AST: 28 [IU]/L (ref 0–40)
Albumin: 4.5 g/dL (ref 3.7–4.7)
Alkaline Phosphatase: 47 [IU]/L (ref 44–121)
BUN/Creatinine Ratio: 14 (ref 10–24)
BUN: 17 mg/dL (ref 8–27)
Bilirubin Total: 0.3 mg/dL (ref 0.0–1.2)
CO2: 24 mmol/L (ref 20–29)
Calcium: 10 mg/dL (ref 8.6–10.2)
Chloride: 102 mmol/L (ref 96–106)
Creatinine, Ser: 1.21 mg/dL (ref 0.76–1.27)
Globulin, Total: 2.1 g/dL (ref 1.5–4.5)
Glucose: 108 mg/dL — ABNORMAL HIGH (ref 70–99)
Potassium: 4.6 mmol/L (ref 3.5–5.2)
Sodium: 141 mmol/L (ref 134–144)
Total Protein: 6.6 g/dL (ref 6.0–8.5)
eGFR: 58 mL/min/{1.73_m2} — ABNORMAL LOW (ref >59)

## 2023-10-13 LAB — CBC WITH DIFFERENTIAL/PLATELET
Basophils Absolute: 0 10*3/uL (ref 0.0–0.2)
Basos: 0 %
EOS (ABSOLUTE): 0.1 10*3/uL (ref 0.0–0.4)
Eos: 2 %
Hematocrit: 42.8 % (ref 37.5–51.0)
Hemoglobin: 14.3 g/dL (ref 13.0–17.7)
Immature Grans (Abs): 0 10*3/uL (ref 0.0–0.1)
Immature Granulocytes: 0 %
Lymphocytes Absolute: 2 10*3/uL (ref 0.7–3.1)
Lymphs: 32 %
MCH: 31.8 pg (ref 26.6–33.0)
MCHC: 33.4 g/dL (ref 31.5–35.7)
MCV: 95 fL (ref 79–97)
Monocytes Absolute: 0.6 10*3/uL (ref 0.1–0.9)
Monocytes: 10 %
Neutrophils Absolute: 3.5 10*3/uL (ref 1.4–7.0)
Neutrophils: 56 %
Platelets: 185 10*3/uL (ref 150–450)
RBC: 4.49 x10E6/uL (ref 4.14–5.80)
RDW: 12.7 % (ref 11.6–15.4)
WBC: 6.3 10*3/uL (ref 3.4–10.8)

## 2023-10-13 LAB — B12 AND FOLATE PANEL
Folate: 10.9 ng/mL (ref >3.0)
Vitamin B-12: 484 pg/mL (ref 232–1245)

## 2023-10-13 LAB — LIPID PANEL
Chol/HDL Ratio: 2.7 {ratio} (ref 0.0–5.0)
Cholesterol, Total: 138 mg/dL (ref 100–199)
HDL: 52 mg/dL (ref >39)
LDL Chol Calc (NIH): 60 mg/dL (ref 0–99)
Triglycerides: 156 mg/dL — ABNORMAL HIGH (ref 0–149)
VLDL Cholesterol Cal: 26 mg/dL (ref 5–40)

## 2023-10-13 LAB — HEMOGLOBIN A1C
Est. average glucose Bld gHb Est-mCnc: 151 mg/dL
Hgb A1c MFr Bld: 6.9 % — ABNORMAL HIGH (ref 4.8–5.6)

## 2023-10-13 LAB — MICROALBUMIN / CREATININE URINE RATIO
Creatinine, Urine: 105.7 mg/dL
Microalb/Creat Ratio: 12 mg/g{creat} (ref 0–29)
Microalbumin, Urine: 12.7 ug/mL

## 2023-12-30 ENCOUNTER — Ambulatory Visit: Payer: Medicare PPO | Admitting: Cardiovascular Disease

## 2023-12-30 ENCOUNTER — Encounter: Payer: Self-pay | Admitting: Cardiovascular Disease

## 2023-12-30 VITALS — BP 117/68 | HR 57 | Ht 65.0 in | Wt 223.0 lb

## 2023-12-30 DIAGNOSIS — I10 Essential (primary) hypertension: Secondary | ICD-10-CM | POA: Diagnosis not present

## 2023-12-30 DIAGNOSIS — E118 Type 2 diabetes mellitus with unspecified complications: Secondary | ICD-10-CM

## 2023-12-30 DIAGNOSIS — R55 Syncope and collapse: Secondary | ICD-10-CM | POA: Diagnosis not present

## 2023-12-30 DIAGNOSIS — Z951 Presence of aortocoronary bypass graft: Secondary | ICD-10-CM

## 2023-12-30 DIAGNOSIS — I2510 Atherosclerotic heart disease of native coronary artery without angina pectoris: Secondary | ICD-10-CM

## 2023-12-30 NOTE — Progress Notes (Signed)
 Cardiology Office Note   Date:  12/30/2023   ID:  Jeffery Ward, DOB 07-03-1933, MRN [id number]  PCP:  Alease Medina, MD  Cardiologist:  Adrian Blackwater, MD      History of Present Illness: Jeffery Ward is a 89 y.o. male who presents for  Chief Complaint  Patient presents with   Follow-up    3 month follow up    Has not much energy      Past Medical History:  Diagnosis Date   Anxiety    Arthritis    back   Balanitis    BPH without obstruction/lower urinary tract symptoms    Coronary artery arteriosclerosis    Erectile dysfunction    GERD (gastroesophageal reflux disease)    Hearing difficulty    Hematuria    Hypogonadism in male    Kidney stones    Microscopic hematuria    Obesity    Urinary urgency      Past Surgical History:  Procedure Laterality Date   CORONARY ARTERY BYPASS GRAFT  2011   CYSTOSCOPY WITH HOLMIUM LASER LITHOTRIPSY  12/09/2021   Procedure: CYSTOSCOPY WITH HOLMIUM LASER LITHOTRIPSY;  Surgeon: Riki Altes, MD;  Location: [facility];  Service: Urology;;   CYSTOSCOPY WITH LITHOLAPAXY N/A 12/09/2021   Procedure: CYSTOSCOPY WITH LITHOLAPAXY;  Surgeon: Riki Altes, MD;  Location: [facility];  Service: Urology;  Laterality: N/A;   CYSTOSCOPY WITH STENT PLACEMENT Left 11/11/2021   Procedure: CYSTOSCOPY WITH STENT PLACEMENT;  Surgeon: Riki Altes, MD;  Location: [facility];  Service: Urology;  Laterality: Left;   CYSTOSCOPY/RETROGRADE/URETEROSCOPY/STONE EXTRACTION WITH BASKET  12/09/2021   Procedure: CYSTOSCOPY/RETROGRADE/URETEROSCOPY/STONE EXTRACTION WITH BASKET;  Surgeon: Riki Altes, MD;  Location: [facility];  Service: Urology;;   LITHOTRIPSY       Current Outpatient Medications  Medication Sig Dispense Refill   aspirin 81 MG EC tablet Take 81 mg by mouth daily. Swallow whole.     Cholecalciferol (D3-1000 PO) Take by mouth.     clopidogrel (PLAVIX) 75 MG tablet TAKE ONE TABLET EVERY DAY 90 tablet 1   Coenzyme Q10 (COQ10) 200  MG CAPS Take 200 mg by mouth daily.     ferrous sulfate 324 (65 Fe) MG TBEC Take by mouth.     metoprolol succinate (TOPROL-XL) 25 MG 24 hr tablet TAKE ONE TABLET BY MOUTH EVERY DAY 90 tablet 0   pantoprazole (PROTONIX) 40 MG tablet TAKE 1 TABLET BY MOUTH DAILY 90 tablet 0   potassium citrate (UROCIT-K) 10 MEQ (1080 MG) SR tablet Take 1 tablet (10 mEq total) by mouth daily. 30 tablet 3   rosuvastatin (CRESTOR) 20 MG tablet Take 20 mg by mouth at bedtime.     vitamin C (ASCORBIC ACID) 250 MG tablet Take 500 mg by mouth daily.     No current facility-administered medications for this visit.    Allergies:   Amoxicillin, Azithromycin, Penicillins, and Quinapril    Social History:   reports that he has never smoked. He does not have any smokeless tobacco history on file. He reports that he does not drink alcohol and does not use drugs.   Family History:  family history includes Pneumonia in his father and mother.    ROS:     Review of Systems  Constitutional: Negative.   HENT: Negative.    Eyes: Negative.   Respiratory: Negative.    Gastrointestinal: Negative.   Genitourinary: Negative.   Musculoskeletal: Negative.   Skin: Negative.  Neurological: Negative.   Endo/Heme/Allergies: Negative.   Psychiatric/Behavioral: Negative.    All other systems reviewed and are negative.     All other systems are reviewed and negative.    PHYSICAL EXAM: VS:  BP 117/68   Pulse (!) 57   Ht 5\' 5"  (1.651 m)   Wt 223 lb (101.2 kg)   SpO2 98%   BMI 37.11 kg/m  , BMI Body mass index is 37.11 kg/m. Last weight:  Wt Readings from Last 3 Encounters:  12/30/23 223 lb (101.2 kg)  10/12/23 234 lb (106.1 kg)  10/01/23 224 lb 4.8 oz (101.7 kg)     Physical Exam Vitals reviewed.  Constitutional:      Appearance: Normal appearance. He is normal weight.  HENT:     Head: Normocephalic.     Nose: Nose normal.     Mouth/Throat:     Mouth: Mucous membranes are moist.  Eyes:     Pupils: Pupils  are equal, round, and reactive to light.  Cardiovascular:     Rate and Rhythm: Normal rate and regular rhythm.     Pulses: Normal pulses.     Heart sounds: Normal heart sounds.  Pulmonary:     Effort: Pulmonary effort is normal.  Abdominal:     General: Abdomen is flat. Bowel sounds are normal.  Musculoskeletal:        General: Normal range of motion.     Cervical back: Normal range of motion.  Skin:    General: Skin is warm.  Neurological:     General: No focal deficit present.     Mental Status: He is alert.  Psychiatric:        Mood and Affect: Mood normal.       EKG:   Recent Labs: 10/12/2023: ALT 20; BUN 17; Creatinine, Ser 1.21; Hemoglobin 14.3; Platelets 185; Potassium 4.6; Sodium 141    Lipid Panel    Component Value Date/Time   CHOL 138 10/12/2023 1525   TRIG 156 (H) 10/12/2023 1525   HDL 52 10/12/2023 1525   CHOLHDL 2.7 10/12/2023 1525   LDLCALC 60 10/12/2023 1525      Other studies Reviewed: Additional studies/ records that were reviewed today include:  Review of the above records demonstrates:       No data to display            ASSESSMENT AND PLAN:    ICD-10-CM   1. Coronary artery disease involving native coronary artery of native heart without angina pectoris  I25.10    NO chest pains    2. Primary hypertension  I10     3. Controlled type 2 diabetes mellitus with complication, without long-term current use of insulin (HCC)  E11.8     4. Syncope, unspecified syncope type  R55     5. Hx of CABG  Z95.1        Problem List Items Addressed This Visit       Cardiovascular and Mediastinum   CAD (coronary artery disease) - Primary   Primary hypertension     Endocrine   Controlled diabetes mellitus type 2 with complications (HCC)     Other   Hx of CABG   Other Visit Diagnoses       Syncope, unspecified syncope type              Disposition:   No follow-ups on file.    Total time spent: 30 minutes  Signed,  Adrian Blackwater, MD  12/30/2023  10:40 AM    [facility]

## 2024-01-11 ENCOUNTER — Encounter: Payer: Self-pay | Admitting: Family Medicine

## 2024-01-11 ENCOUNTER — Ambulatory Visit: Payer: Medicare PPO | Admitting: Family Medicine

## 2024-01-11 VITALS — BP 146/80 | HR 63 | Temp 98.4°F | Resp 18 | Ht 65.0 in | Wt 222.0 lb

## 2024-01-11 DIAGNOSIS — E119 Type 2 diabetes mellitus without complications: Secondary | ICD-10-CM

## 2024-01-11 DIAGNOSIS — F419 Anxiety disorder, unspecified: Secondary | ICD-10-CM

## 2024-01-11 DIAGNOSIS — I10 Essential (primary) hypertension: Secondary | ICD-10-CM | POA: Diagnosis not present

## 2024-01-11 DIAGNOSIS — E118 Type 2 diabetes mellitus with unspecified complications: Secondary | ICD-10-CM | POA: Diagnosis not present

## 2024-01-11 DIAGNOSIS — I2510 Atherosclerotic heart disease of native coronary artery without angina pectoris: Secondary | ICD-10-CM

## 2024-01-11 LAB — POCT GLYCOSYLATED HEMOGLOBIN (HGB A1C): Hemoglobin A1C: 6.8 % — AB (ref 4.0–5.6)

## 2024-01-11 MED ORDER — BUSPIRONE HCL 5 MG PO TABS: 5.0000 mg | ORAL_TABLET | Freq: Three times a day (TID) | ORAL | 3 refills | Status: DC

## 2024-01-11 NOTE — Assessment & Plan Note (Addendum)
 Checking lipid profile.  Goal LDL less than 70.  Takes 81 mg aspirin  daily.  Is on Plavix 75 mg daily

## 2024-01-11 NOTE — Assessment & Plan Note (Signed)
 Blood pressure more normal today at 146/80.  He takes metoprolol  succinate 25 mg daily

## 2024-01-11 NOTE — Assessment & Plan Note (Signed)
 Diabetes is controlled with diet and exercise.  A1c today is 6.8%

## 2024-01-11 NOTE — Progress Notes (Unsigned)
 Established Patient Office Visit  Subjective   Patient ID: Jeffery Ward, male    DOB: [birth year]  Age: 89 y.o. MRN: [id number]  Chief Complaint  Patient presents with   Medical Management of Chronic Issues    HPI Delightful 89 year old gentleman never smoker with BPH and LUTS, CAD s/p CABG, DMT2 (exercise and diet controlled), GERD, mixed hyperlipidemia, HTN, anxiety. Family is very worried about him driving.  He just drives the 2 lane back roads and he goes to flea markets and antique stores where he can chat with his friends. Just saw his cardiologist Dr. Noreen Beard. No change in his medications. Gets up 3-5 times at night to void but is able to go right back to sleep.  Uses a hand held urinal so he does not have to walk to the bathroom.   Never checks his blood sugar.  His diabetes is under control using his diet and exercise. Checks his feet daily. Complains about weakness in his right knee and he is afraid of falling.  Is now using a rollator in the house.  He walks with a cane in the yard.  He has not fallen.  Has a lifeline.   Occasionally has anxiety and will take BuSpar  5 mg for this.  BuSpar  is adequate for this problem.    {History (Optional):23778}  ROS    Objective:     BP (!) 146/80 (BP Location: Left Arm, Patient Position: Sitting, Cuff Size: Normal)   Pulse 63   Temp 98.4 F (36.9 C) (Oral)   Resp 18   Ht 5\' 5"  (1.651 m)   Wt 222 lb (100.7 kg)   SpO2 94%   BMI 36.94 kg/m  {Vitals History (Optional):23777}  Physical Exam Vitals and nursing note reviewed.  Constitutional:      Appearance: Normal appearance.  HENT:     Head: Normocephalic and atraumatic.  Eyes:     Conjunctiva/sclera: Conjunctivae normal.  Cardiovascular:     Rate and Rhythm: Normal rate and regular rhythm.  Pulmonary:     Effort: Pulmonary effort is normal.     Breath sounds: Normal breath sounds.  Musculoskeletal:     Right lower leg: No edema.     Left lower leg: No edema.   Skin:    General: Skin is warm and dry.  Neurological:     Mental Status: He is alert and oriented to person, place, and time.  Psychiatric:        Mood and Affect: Mood normal.        Behavior: Behavior normal.        Thought Content: Thought content normal.        Judgment: Judgment normal.      {Perform Simple Foot Exam  Perform Detailed exam:1} {Insert foot Exam (Optional):30965}   Results for orders placed or performed in visit on 01/11/24  POCT glycosylated hemoglobin (Hb A1C)  Result Value Ref Range   Hemoglobin A1C 6.8 (A) 4.0 - 5.6 %   HbA1c POC (<> result, manual entry)     HbA1c, POC (prediabetic range)     HbA1c, POC (controlled diabetic range)      {Labs (Optional):23779}  The ASCVD Risk score (Arnett DK, et al., 2019) failed to calculate for the following reasons:   The 2019 ASCVD risk score is only valid for ages 64 to 60    Assessment & Plan:  Type 2 diabetes mellitus without complication, without long-term current use of insulin (HCC) -  POCT glycosylated hemoglobin (Hb A1C) -     Lipid panel -     CBC with Differential/Platelet -     Comprehensive metabolic panel with GFR -     Microalbumin / creatinine urine ratio  Primary hypertension Assessment & Plan: Blood pressure more normal today at 146/80.  He takes metoprolol  succinate 25 mg daily  Orders: -     CBC with Differential/Platelet  Anxiety -     busPIRone  HCl; Take 1 tablet (5 mg total) by mouth 3 (three) times daily.  Dispense: 90 tablet; Refill: 3  Coronary artery disease involving native coronary artery of native heart without angina pectoris Assessment & Plan: Checking lipid profile.  Goal LDL less than 70.  Takes 81 mg aspirin  daily.  Is on Plavix 75 mg daily   Controlled type 2 diabetes mellitus with complication, without long-term current use of insulin (HCC) Assessment & Plan: Diabetes is controlled with diet and exercise.  A1c today is 6.8%      Return in about 3  months (around 04/11/2024).    Shenae Bonanno K Rikia Sukhu, MD

## 2024-01-12 ENCOUNTER — Encounter: Payer: Self-pay | Admitting: Family Medicine

## 2024-01-12 LAB — COMPREHENSIVE METABOLIC PANEL WITH GFR
ALT: 21 IU/L (ref 0–44)
AST: 24 IU/L (ref 0–40)
Albumin: 4.5 g/dL (ref 3.7–4.7)
Alkaline Phosphatase: 45 IU/L (ref 44–121)
BUN/Creatinine Ratio: 15 (ref 10–24)
BUN: 18 mg/dL (ref 8–27)
Bilirubin Total: 0.5 mg/dL (ref 0.0–1.2)
CO2: 23 mmol/L (ref 20–29)
Calcium: 9.5 mg/dL (ref 8.6–10.2)
Chloride: 102 mmol/L (ref 96–106)
Creatinine, Ser: 1.23 mg/dL (ref 0.76–1.27)
Globulin, Total: 1.9 g/dL (ref 1.5–4.5)
Glucose: 127 mg/dL — ABNORMAL HIGH (ref 70–99)
Potassium: 4.4 mmol/L (ref 3.5–5.2)
Sodium: 139 mmol/L (ref 134–144)
Total Protein: 6.4 g/dL (ref 6.0–8.5)
eGFR: 56 mL/min/{1.73_m2} — ABNORMAL LOW (ref >59)

## 2024-01-12 LAB — CBC WITH DIFFERENTIAL/PLATELET
Basophils Absolute: 0 10*3/uL (ref 0.0–0.2)
Basos: 0 %
EOS (ABSOLUTE): 0.2 10*3/uL (ref 0.0–0.4)
Eos: 2 %
Hematocrit: 42.7 % (ref 37.5–51.0)
Hemoglobin: 14.1 g/dL (ref 13.0–17.7)
Immature Grans (Abs): 0 10*3/uL (ref 0.0–0.1)
Immature Granulocytes: 0 %
Lymphocytes Absolute: 2.3 10*3/uL (ref 0.7–3.1)
Lymphs: 27 %
MCH: 31.8 pg (ref 26.6–33.0)
MCHC: 33 g/dL (ref 31.5–35.7)
MCV: 96 fL (ref 79–97)
Monocytes Absolute: 0.6 10*3/uL (ref 0.1–0.9)
Monocytes: 7 %
Neutrophils Absolute: 5.4 10*3/uL (ref 1.4–7.0)
Neutrophils: 64 %
Platelets: 179 10*3/uL (ref 150–450)
RBC: 4.43 x10E6/uL (ref 4.14–5.80)
RDW: 13.2 % (ref 11.6–15.4)
WBC: 8.4 10*3/uL (ref 3.4–10.8)

## 2024-01-12 LAB — MICROALBUMIN / CREATININE URINE RATIO
Creatinine, Urine: 144.2 mg/dL
Microalb/Creat Ratio: 12 mg/g{creat} (ref 0–29)
Microalbumin, Urine: 16.7 ug/mL

## 2024-01-12 LAB — LIPID PANEL
Chol/HDL Ratio: 2.6 ratio (ref 0.0–5.0)
Cholesterol, Total: 148 mg/dL (ref 100–199)
HDL: 57 mg/dL (ref >39)
LDL Chol Calc (NIH): 66 mg/dL (ref 0–99)
Triglycerides: 146 mg/dL (ref 0–149)
VLDL Cholesterol Cal: 25 mg/dL (ref 5–40)

## 2024-01-14 ENCOUNTER — Other Ambulatory Visit: Payer: Self-pay | Admitting: Family Medicine

## 2024-01-14 DIAGNOSIS — B354 Tinea corporis: Secondary | ICD-10-CM

## 2024-01-14 MED ORDER — NYSTATIN 100000 UNIT/GM EX CREA: 1.0000 | TOPICAL_CREAM | Freq: Two times a day (BID) | CUTANEOUS | 1 refills | Status: DC

## 2024-02-25 ENCOUNTER — Encounter: Payer: Self-pay | Admitting: Ophthalmology

## 2024-03-06 NOTE — Discharge Instructions (Signed)

## 2024-03-08 ENCOUNTER — Ambulatory Visit: Payer: Self-pay | Admitting: Anesthesiology

## 2024-03-08 ENCOUNTER — Ambulatory Visit
Admission: RE | Admit: 2024-03-08 | Discharge: 2024-03-08 | Disposition: A | Discharge status: Home / Self Care | Attending: Ophthalmology | Admitting: Ophthalmology

## 2024-03-08 ENCOUNTER — Other Ambulatory Visit: Payer: Self-pay

## 2024-03-08 ENCOUNTER — Encounter: Payer: Self-pay | Admitting: Ophthalmology

## 2024-03-08 ENCOUNTER — Encounter
Admission: RE | Disposition: A | Discharge status: Home / Self Care | Payer: Self-pay | Source: Home / Self Care | Attending: Ophthalmology

## 2024-03-08 DIAGNOSIS — Z951 Presence of aortocoronary bypass graft: Secondary | ICD-10-CM | POA: Diagnosis not present

## 2024-03-08 DIAGNOSIS — I2510 Atherosclerotic heart disease of native coronary artery without angina pectoris: Secondary | ICD-10-CM | POA: Insufficient documentation

## 2024-03-08 DIAGNOSIS — H2512 Age-related nuclear cataract, left eye: Secondary | ICD-10-CM | POA: Diagnosis present

## 2024-03-08 DIAGNOSIS — I10 Essential (primary) hypertension: Secondary | ICD-10-CM | POA: Insufficient documentation

## 2024-03-08 DIAGNOSIS — H5703 Miosis: Secondary | ICD-10-CM | POA: Diagnosis not present

## 2024-03-08 DIAGNOSIS — E1136 Type 2 diabetes mellitus with diabetic cataract: Secondary | ICD-10-CM | POA: Diagnosis not present

## 2024-03-08 HISTORY — DX: Essential (primary) hypertension: I10

## 2024-03-08 HISTORY — DX: Presence of aortocoronary bypass graft: Z95.1

## 2024-03-08 SURGERY — PHACOEMULSIFICATION, CATARACT, WITH IOL INSERTION
Anesthesia: Monitor Anesthesia Care | Site: Eye | Laterality: Left

## 2024-03-08 MED ORDER — SIGHTPATH DOSE#1 BSS IO SOLN
INTRAOCULAR | Status: DC | PRN
  Administered 2024-03-08: 15 mL via INTRAOCULAR

## 2024-03-08 MED ORDER — FENTANYL CITRATE (PF) 100 MCG/2ML IJ SOLN
INTRAMUSCULAR | Status: DC | PRN
  Administered 2024-03-08: 50 ug via INTRAVENOUS

## 2024-03-08 MED ORDER — SIGHTPATH DOSE#1 BSS IO SOLN
INTRAOCULAR | Status: DC | PRN
  Administered 2024-03-08: 54 mL via OPHTHALMIC

## 2024-03-08 MED ORDER — DEXMEDETOMIDINE HCL IN NACL 80 MCG/20ML IV SOLN
INTRAVENOUS | Status: DC | PRN
  Administered 2024-03-08: 4 ug via INTRAVENOUS

## 2024-03-08 MED ORDER — LACTATED RINGERS IV SOLN: INTRAVENOUS | Status: DC

## 2024-03-08 MED ORDER — TETRACAINE HCL 0.5 % OP SOLN
OPHTHALMIC | Status: AC
  Filled 2024-03-08: qty 4

## 2024-03-08 MED ORDER — LIDOCAINE HCL (PF) 2 % IJ SOLN
INTRAOCULAR | Status: DC | PRN
  Administered 2024-03-08: 2 mL

## 2024-03-08 MED ORDER — FENTANYL CITRATE (PF) 100 MCG/2ML IJ SOLN
INTRAMUSCULAR | Status: AC
Start: 2024-03-08 — End: 2024-03-08
  Filled 2024-03-08: qty 2

## 2024-03-08 MED ORDER — BRIMONIDINE TARTRATE-TIMOLOL 0.2-0.5 % OP SOLN
OPHTHALMIC | Status: DC | PRN
Start: 2024-03-08 — End: 2024-03-08
  Administered 2024-03-08: 1 [drp] via OPHTHALMIC

## 2024-03-08 MED ORDER — ARMC OPHTHALMIC DILATING DROPS
1.0000 | OPHTHALMIC | Status: DC | PRN
  Administered 2024-03-08 (×3): 1 via OPHTHALMIC

## 2024-03-08 MED ORDER — SIGHTPATH DOSE#1 NA HYALUR & NA CHOND-NA HYALUR IO KIT
PACK | INTRAOCULAR | Status: DC | PRN
  Administered 2024-03-08: 1 via OPHTHALMIC

## 2024-03-08 MED ORDER — TETRACAINE HCL 0.5 % OP SOLN
1.0000 [drp] | OPHTHALMIC | Status: DC | PRN
  Administered 2024-03-08 (×3): 1 [drp] via OPHTHALMIC

## 2024-03-08 MED ORDER — MOXIFLOXACIN HCL 0.5 % OP SOLN
OPHTHALMIC | Status: DC | PRN
  Administered 2024-03-08: .2 mL via OPHTHALMIC

## 2024-03-08 MED ORDER — ONDANSETRON HCL 4 MG/2ML IJ SOLN
INTRAMUSCULAR | Status: DC | PRN
  Administered 2024-03-08: 4 mg via INTRAVENOUS

## 2024-03-08 MED ORDER — ARMC OPHTHALMIC DILATING DROPS
OPHTHALMIC | Status: AC
Start: 2024-03-08 — End: 2024-03-08
  Filled 2024-03-08: qty 0.5

## 2024-03-08 SURGICAL SUPPLY — 11 items
CATARACT SUITE SIGHTPATH (MISCELLANEOUS) ×1 IMPLANT
FEE CATARACT SUITE SIGHTPATH (MISCELLANEOUS) ×1 IMPLANT
GLOVE BIOGEL PI IND STRL 8 (GLOVE) ×1 IMPLANT
GLOVE SURG LX STRL 7.5 STRW (GLOVE) ×1 IMPLANT
GLOVE SURG PROTEXIS BL SZ6.5 (GLOVE) ×1 IMPLANT
GLOVE SURG SYN 6.5 PF PI BL (GLOVE) ×1 IMPLANT
LENS IOL TECNIS EYHANCE 20.5 (Intraocular Lens) IMPLANT
NDL FILTER BLUNT 18X1 1/2 (NEEDLE) ×1 IMPLANT
NEEDLE FILTER BLUNT 18X1 1/2 (NEEDLE) ×1 IMPLANT
RING MALYGIN 7.0 (MISCELLANEOUS) IMPLANT
SYR 3ML LL SCALE MARK (SYRINGE) ×1 IMPLANT

## 2024-03-08 NOTE — Anesthesia Preprocedure Evaluation (Signed)
 Anesthesia Evaluation  Patient identified by MRN, date of birth, ID band Patient awake    Reviewed: Allergy & Precautions, H&P , NPO status , Patient's Chart, lab work & pertinent test results  History of Anesthesia Complications (+) DIFFICULT AIRWAY and history of anesthetic complications  Airway Mallampati: II  TM Distance: >3 FB Neck ROM: full    Dental no notable dental hx.    Pulmonary neg pulmonary ROS   Pulmonary exam normal        Cardiovascular hypertension, + CAD and + CABG  Normal cardiovascular exam     Neuro/Psych negative neurological ROS  negative psych ROS   GI/Hepatic negative GI ROS, Neg liver ROS,,,  Endo/Other  diabetes, Type 2    Renal/GU      Musculoskeletal   Abdominal   Peds  Hematology negative hematology ROS (+)   Anesthesia Other Findings Past Medical History: No date: Anxiety No date: Arthritis     Comment:  back No date: Balanitis No date: BPH without obstruction/lower urinary tract symptoms No date: Coronary artery arteriosclerosis No date: Erectile dysfunction No date: GERD (gastroesophageal reflux disease) No date: Hearing difficulty No date: Hematuria No date: Hx of CABG No date: Hypogonadism in male No date: Kidney stones No date: Microscopic hematuria No date: Obesity No date: Primary hypertension No date: Urinary urgency  Past Surgical History: 2011: CORONARY ARTERY BYPASS GRAFT 12/09/2021: CYSTOSCOPY WITH HOLMIUM LASER LITHOTRIPSY     Comment:  Procedure: CYSTOSCOPY WITH HOLMIUM LASER LITHOTRIPSY;                Surgeon: Geraline Knapp, MD;  Location: [facility];                Service: Urology;; 12/09/2021: Orin Birk WITH LITHOLAPAXY; N/A     Comment:  Procedure: CYSTOSCOPY WITH LITHOLAPAXY;  Surgeon:               Geraline Knapp, MD;  Location: [facility];  Service:               Urology;  Laterality: N/A; 11/11/2021: CYSTOSCOPY WITH STENT PLACEMENT; Left      Comment:  Procedure: CYSTOSCOPY WITH STENT PLACEMENT;  Surgeon:               Geraline Knapp, MD;  Location: [facility];  Service:               Urology;  Laterality: Left; 12/09/2021: CYSTOSCOPY/RETROGRADE/URETEROSCOPY/STONE EXTRACTION WITH  BASKET     Comment:  Procedure: CYSTOSCOPY/RETROGRADE/URETEROSCOPY/STONE               EXTRACTION WITH BASKET;  Surgeon: Geraline Knapp, MD;                Location: [facility];  Service: Urology;; No date: LITHOTRIPSY  BMI    Body Mass Index: 35.35 kg/m      Reproductive/Obstetrics negative OB ROS                              Anesthesia Physical Anesthesia Plan  ASA: 3  Anesthesia Plan: MAC   Post-op Pain Management:    Induction:   PONV Risk Score and Plan:   Airway Management Planned:   Additional Equipment:   Intra-op Plan:   Post-operative Plan:   Informed Consent:   Plan Discussed with: Anesthesiologist, CRNA and Surgeon  Anesthesia Plan Comments:          Anesthesia Quick Evaluation

## 2024-03-08 NOTE — Anesthesia Postprocedure Evaluation (Signed)
 Anesthesia Post Note  Patient: Jeffery Ward  Procedure(s) Performed: PHACOEMULSIFICATION, CATARACT, WITH IOL INSERTION 7.14 00:43.5 (Left: Eye)  Patient location during evaluation: PACU Anesthesia Type: MAC Level of consciousness: awake and alert Pain management: pain level controlled Vital Signs Assessment: post-procedure vital signs reviewed and stable Respiratory status: spontaneous breathing, nonlabored ventilation and respiratory function stable Cardiovascular status: stable and blood pressure returned to baseline Postop Assessment: no apparent nausea or vomiting Anesthetic complications: no   No notable events documented.   Last Vitals:  Vitals:   03/08/24 1115 03/08/24 1124  BP: 133/61 115/74  Pulse: (!) 50 (!) 48  Resp: 14 14  Temp:    SpO2: 98% 97%    Last Pain:  Vitals:   03/08/24 1124  TempSrc:   PainSc: 0-No pain                 Baltazar Bonier

## 2024-03-08 NOTE — Transfer of Care (Signed)
 Immediate Anesthesia Transfer of Care Note  Patient: Jeffery Ward  Procedure(s) Performed: PHACOEMULSIFICATION, CATARACT, WITH IOL INSERTION 7.14 00:43.5 (Left: Eye)  Patient Location: PACU  Anesthesia Type: MAC  Level of Consciousness: awake, alert  and patient cooperative  Airway and Oxygen Therapy: Patient Spontanous Breathing and Patient connected to supplemental oxygen  Post-op Assessment: Post-op Vital signs reviewed, Patient's Cardiovascular Status Stable, Respiratory Function Stable, Patent Airway and No signs of Nausea or vomiting  Post-op Vital Signs: Reviewed and stable  Complications: No notable events documented.

## 2024-03-08 NOTE — Op Note (Signed)
 OPERATIVE NOTE  LADISLAO COHENOUR [id number] 03/08/2024  PREOPERATIVE DIAGNOSIS:   Nuclear sclerotic cataract left eye with miotic pupil      H25.12   POSTOPERATIVE DIAGNOSIS:   Nuclear sclerotic cataract left eye with miotic pupil.     PROCEDURE:  Phacoemulsification with posterior chamber intraocular lens implantation of the left eye which required pupil stretching with the Malyugin pupil expansion device  Ultrasound time: Procedure(s): PHACOEMULSIFICATION, CATARACT, WITH IOL INSERTION 7.14 00:43.5 (Left)  LENS:   Implant Name Type Inv. Item Serial No. Manufacturer Lot No. LRB No. Used Action  LENS IOL TECNIS EYHANCE 20.5 - [id number] Intraocular Lens LENS IOL TECNIS EYHANCE 20.5 [id number] [employer]  Left 1 Implanted         SURGEON:  Berline Brenner, MD   ANESTHESIA: Topical with tetracaine drops and 2% Xylocaine  jelly, augmented with 1% preservative-free intracameral lidocaine .   COMPLICATIONS:  None.   DESCRIPTION OF PROCEDURE:  The patient was identified in the holding room and transported to the operating room and placed in the supine position under the operating microscope.  The left eye was identified as the operative eye and it was prepped and draped in the usual sterile ophthalmic fashion.   A 1 millimeter clear-corneal paracentesis was made at the 1:30 position.  The anterior chamber was filled with Viscoat viscoelastic.  0.5 ml of preservative-free 1% lidocaine  was injected into the anterior chamber.  A 2.4 millimeter keratome was used to make a near-clear corneal incision at the 10:30 position.  A Malyugin pupil expander was then placed through the main incision and into the anterior chamber of the eye.  The edge of the iris was secured on the lip of the pupil expander and it was released, thereby expanding the pupil to approximately 7 millimeters for completion of the cataract surgery.  Additional Viscoat was placed in the anterior chamber.  A cystotome and  capsulorrhexis forceps were used to make a curvilinear capsulorrhexis.   Balanced salt solution was used to hydrodissect and hydrodelineate the lens nucleus.   Phacoemulsification was used in stop and chop fashion to remove the lens, nucleus and epinucleus.  The remaining cortex was aspirated using the irrigation aspiration handpiece.  Additional Provisc was placed into the eye to distend the capsular bag for lens placement.  A lens was then injected into the capsular bag.  The pupil expanding ring was removed using a Kuglen hook and insertion device. The remaining viscoelastic was aspirated from the capsular bag and the anterior chamber.  The anterior chamber was filled with balanced salt solution to inflate to a physiologic pressure.   Wounds were hydrated with balanced salt solution.  The anterior chamber was inflated to a physiologic pressure with balanced salt solution.  No wound leaks were noted. Cefuroxime 0.1 ml of a 10mg /ml solution was injected into the anterior chamber for a dose of 1 mg of intracameral antibiotic at the completion of the case.   Timolol and Brimonidine drops were applied to the eye.  The patient was taken to the recovery room in stable condition without complications of anesthesia or surgery.  Neema Barreira 03/08/2024, 10:56 AM

## 2024-03-08 NOTE — H&P (Signed)
 [facility]   Primary Care Physician:  Ziglar, Lacinda Pica, MD Ophthalmologist: Dr. Annell Kidney  Pre-Procedure History & Physical: HPI:  Jeffery Ward is a 89 y.o. male here for ophthalmic surgery.   Past Medical History:  Diagnosis Date   Anxiety    Arthritis    back   Balanitis    BPH without obstruction/lower urinary tract symptoms    Coronary artery arteriosclerosis    Erectile dysfunction    GERD (gastroesophageal reflux disease)    Hearing difficulty    Hematuria    Hx of CABG    Hypogonadism in male    Kidney stones    Microscopic hematuria    Obesity    Primary hypertension    Urinary urgency     Past Surgical History:  Procedure Laterality Date   CORONARY ARTERY BYPASS GRAFT  2011   CYSTOSCOPY WITH HOLMIUM LASER LITHOTRIPSY  12/09/2021   Procedure: CYSTOSCOPY WITH HOLMIUM LASER LITHOTRIPSY;  Surgeon: Geraline Knapp, MD;  Location: [facility];  Service: Urology;;   CYSTOSCOPY WITH LITHOLAPAXY N/A 12/09/2021   Procedure: CYSTOSCOPY WITH LITHOLAPAXY;  Surgeon: Geraline Knapp, MD;  Location: [facility];  Service: Urology;  Laterality: N/A;   CYSTOSCOPY WITH STENT PLACEMENT Left 11/11/2021   Procedure: CYSTOSCOPY WITH STENT PLACEMENT;  Surgeon: Geraline Knapp, MD;  Location: [facility];  Service: Urology;  Laterality: Left;   CYSTOSCOPY/RETROGRADE/URETEROSCOPY/STONE EXTRACTION WITH BASKET  12/09/2021   Procedure: CYSTOSCOPY/RETROGRADE/URETEROSCOPY/STONE EXTRACTION WITH BASKET;  Surgeon: Geraline Knapp, MD;  Location: [facility];  Service: Urology;;   LITHOTRIPSY      Prior to Admission medications   Medication Sig Start Date End Date Taking? Authorizing Provider  fluticasone  (FLONASE ) 50 MCG/ACT nasal spray Place 2 sprays into both nostrils daily.   Yes [provider]  aspirin  81 MG EC tablet Take 81 mg by mouth daily. Swallow whole.    [provider]  busPIRone  (BUSPAR ) 5 MG tablet Take 1 tablet (5 mg total) by mouth 3 (three) times  daily. 01/11/24   Ziglar, Susan K, MD  Cholecalciferol (D3-1000 PO) Take by mouth.    [provider]  clopidogrel (PLAVIX) 75 MG tablet TAKE ONE TABLET EVERY DAY 04/13/23   Debborah Fairly A, MD  Coenzyme Q10 (COQ10) 200 MG CAPS Take 200 mg by mouth daily.    [provider]  diphenhydramine-acetaminophen  (TYLENOL  PM) 25-500 MG TABS tablet Take 1 tablet by mouth at bedtime as needed.    [provider]  ferrous sulfate  324 (65 Fe) MG TBEC Take by mouth.    [provider]  metoprolol  succinate (TOPROL -XL) 25 MG 24 hr tablet TAKE ONE TABLET BY MOUTH EVERY DAY 02/11/23   Cherrie Cornwall, MD  nystatin  cream (MYCOSTATIN ) Apply 1 Application topically 2 (two) times daily. 01/14/24   Ziglar, Susan K, MD  pantoprazole  (PROTONIX ) 40 MG tablet TAKE 1 TABLET BY MOUTH DAILY 04/06/23   Cherrie Cornwall, MD  potassium citrate  (UROCIT-K ) 10 MEQ (1080 MG) SR tablet Take 1 tablet (10 mEq total) by mouth daily. 10/05/23   Ziglar, Susan K, MD  rosuvastatin (CRESTOR) 20 MG tablet Take 20 mg by mouth at bedtime. 10/11/23   [provider]  vitamin C (ASCORBIC ACID ) 250 MG tablet Take 500 mg by mouth daily.    [provider]    Allergies as of 02/15/2024 - Review Complete 01/11/2024  Allergen Reaction Noted   Amoxicillin Other (See Comments) 06/06/2015   Azithromycin Diarrhea 06/06/2015  Penicillins Other (See Comments) 06/06/2015   Quinapril Other (See Comments) 01/07/2023    Family History  Problem Relation Age of Onset   Pneumonia Mother    Pneumonia Father    Kidney disease Neg Hx    Prostate cancer Neg Hx    Bladder Cancer Neg Hx     Social History   Socioeconomic History   Marital status: Married    Spouse name: Thelma   Number of children: 5   Years of education: Not on file   Highest education level: Not on file  Occupational History   Not on file  Tobacco Use   Smoking status: Never   Smokeless tobacco: Not on file  Vaping Use   Vaping  status: Never Used  Substance and Sexual Activity   Alcohol use: No    Alcohol/week: 0.0 standard drinks of alcohol   Drug use: No   Sexual activity: Not on file  Other Topics Concern   Not on file  Social History Narrative   Not on file   Social Drivers of Health   Financial Resource Strain: Not on file  Food Insecurity: Not on file  Transportation Needs: Not on file  Physical Activity: Not on file  Stress: Not on file  Social Connections: Not on file  Intimate Partner Violence: Not on file    Review of Systems: See HPI, otherwise negative ROS  Physical Exam: Ht 5' 6 (1.676 m)   Wt 99.3 kg   BMI 35.35 kg/m  General:   Alert,  pleasant and cooperative in NAD Head:  Normocephalic and atraumatic. Lungs:  Clear to auscultation.    Heart:  Regular rate and rhythm.   Impression/Plan: Jeffery Ward is here for ophthalmic surgery.  Risks, benefits, limitations, and alternatives regarding ophthalmic surgery have been reviewed with the patient.  Questions have been answered.  All parties agreeable.   Annell Kidney, MD  03/08/2024, 10:12 AM

## 2024-03-09 ENCOUNTER — Encounter: Payer: Self-pay | Admitting: Ophthalmology

## 2024-03-20 NOTE — Discharge Instructions (Signed)

## 2024-03-21 NOTE — Anesthesia Preprocedure Evaluation (Signed)
 Anesthesia Evaluation  Patient identified by MRN, date of birth, ID band Patient awake    Reviewed: Allergy & Precautions, H&P , NPO status , Patient's Chart, lab work & pertinent test results, reviewed documented beta blocker date and time   Airway Mallampati: II  TM Distance: >3 FB Neck ROM: full    Dental no notable dental hx. (+) Teeth Intact   Pulmonary neg pulmonary ROS   Pulmonary exam normal breath sounds clear to auscultation       Cardiovascular Exercise Tolerance: Poor hypertension, On Medications + CAD   Rhythm:regular Rate:Normal     Neuro/Psych   Anxiety     negative neurological ROS  negative psych ROS   GI/Hepatic Neg liver ROS,GERD  Medicated,,  Endo/Other  negative endocrine ROSdiabetes, Well Controlled    Renal/GU Renal disease     Musculoskeletal   Abdominal   Peds  Hematology negative hematology ROS (+)   Anesthesia Other Findings Previous cataract surgery 03-08-24 Dr. Vicci  Medical History  Coronary artery arteriosclerosis Erectile dysfunction Hematuria Hypogonadism in male BPH without obstruction/lower urinary tract symptoms Microscopic hematuria Balanitis Obesity Urinary urgency Kidney stones GERD (gastroesophageal reflux disease) Arthritis Anxiety Hearing difficulty Hx of CABG Primary hypertension    Reproductive/Obstetrics negative OB ROS                              Anesthesia Physical Anesthesia Plan  ASA: 3  Anesthesia Plan: MAC   Post-op Pain Management:    Induction: Intravenous  PONV Risk Score and Plan:   Airway Management Planned: Natural Airway and Nasal Cannula  Additional Equipment:   Intra-op Plan:   Post-operative Plan:   Informed Consent: I have reviewed the patients History and Physical, chart, labs and discussed the procedure including the risks, benefits and alternatives for the proposed anesthesia with the patient or  authorized representative who has indicated his/her understanding and acceptance.     Dental Advisory Given  Plan Discussed with: Anesthesiologist, CRNA and Surgeon  Anesthesia Plan Comments: (Patient consented for risks of anesthesia including but not limited to:  - adverse reactions to medications - damage to eyes, teeth, lips or other oral mucosa - nerve damage due to positioning  - sore throat or hoarseness - Damage to heart, brain, nerves, lungs, other parts of body or loss of life  Patient voiced understanding and assent.)         Anesthesia Quick Evaluation

## 2024-03-22 ENCOUNTER — Ambulatory Visit: Payer: Self-pay | Admitting: Anesthesiology

## 2024-03-22 ENCOUNTER — Encounter: Payer: Self-pay | Admitting: Ophthalmology

## 2024-03-22 ENCOUNTER — Encounter: Payer: Self-pay | Admitting: Anesthesiology

## 2024-03-22 ENCOUNTER — Ambulatory Visit
Admission: RE | Admit: 2024-03-22 | Discharge: 2024-03-22 | Disposition: A | Discharge status: Home / Self Care | Attending: Ophthalmology | Admitting: Ophthalmology

## 2024-03-22 ENCOUNTER — Other Ambulatory Visit: Payer: Self-pay

## 2024-03-22 ENCOUNTER — Encounter
Admission: RE | Disposition: A | Discharge status: Home / Self Care | Payer: Self-pay | Source: Home / Self Care | Attending: Ophthalmology

## 2024-03-22 DIAGNOSIS — I2510 Atherosclerotic heart disease of native coronary artery without angina pectoris: Secondary | ICD-10-CM | POA: Diagnosis not present

## 2024-03-22 DIAGNOSIS — K219 Gastro-esophageal reflux disease without esophagitis: Secondary | ICD-10-CM | POA: Diagnosis not present

## 2024-03-22 DIAGNOSIS — H5703 Miosis: Secondary | ICD-10-CM | POA: Insufficient documentation

## 2024-03-22 DIAGNOSIS — I10 Essential (primary) hypertension: Secondary | ICD-10-CM | POA: Insufficient documentation

## 2024-03-22 DIAGNOSIS — F419 Anxiety disorder, unspecified: Secondary | ICD-10-CM | POA: Diagnosis not present

## 2024-03-22 DIAGNOSIS — E1136 Type 2 diabetes mellitus with diabetic cataract: Secondary | ICD-10-CM | POA: Insufficient documentation

## 2024-03-22 DIAGNOSIS — H2511 Age-related nuclear cataract, right eye: Secondary | ICD-10-CM | POA: Diagnosis present

## 2024-03-22 SURGERY — PHACOEMULSIFICATION, CATARACT, WITH IOL INSERTION
Anesthesia: Monitor Anesthesia Care | Site: Eye | Laterality: Right

## 2024-03-22 MED ORDER — DROPERIDOL 2.5 MG/ML IJ SOLN: 0.6250 mg | Freq: Once | INTRAMUSCULAR | Status: DC | PRN

## 2024-03-22 MED ORDER — MIDAZOLAM HCL 2 MG/2ML IJ SOLN
INTRAMUSCULAR | Status: AC
  Filled 2024-03-22: qty 2

## 2024-03-22 MED ORDER — MOXIFLOXACIN HCL 0.5 % OP SOLN
OPHTHALMIC | Status: DC | PRN
  Administered 2024-03-22: .2 mL via OPHTHALMIC

## 2024-03-22 MED ORDER — OXYCODONE HCL 5 MG/5ML PO SOLN: 5.0000 mg | Freq: Once | ORAL | Status: DC | PRN

## 2024-03-22 MED ORDER — FENTANYL CITRATE (PF) 100 MCG/2ML IJ SOLN
INTRAMUSCULAR | Status: AC
  Filled 2024-03-22: qty 2

## 2024-03-22 MED ORDER — FENTANYL CITRATE (PF) 100 MCG/2ML IJ SOLN
INTRAMUSCULAR | Status: DC | PRN
  Administered 2024-03-22: 25 ug via INTRAVENOUS
  Administered 2024-03-22: 50 ug via INTRAVENOUS

## 2024-03-22 MED ORDER — BRIMONIDINE TARTRATE-TIMOLOL 0.2-0.5 % OP SOLN
OPHTHALMIC | Status: DC | PRN
  Administered 2024-03-22: 1 [drp] via OPHTHALMIC

## 2024-03-22 MED ORDER — LIDOCAINE HCL (PF) 2 % IJ SOLN
INTRAOCULAR | Status: DC | PRN
  Administered 2024-03-22: 2 mL

## 2024-03-22 MED ORDER — SIGHTPATH DOSE#1 NA HYALUR & NA CHOND-NA HYALUR IO KIT
PACK | INTRAOCULAR | Status: DC | PRN
  Administered 2024-03-22: 1 via OPHTHALMIC

## 2024-03-22 MED ORDER — ARMC OPHTHALMIC DILATING DROPS
1.0000 | OPHTHALMIC | Status: DC | PRN
  Administered 2024-03-22 (×3): 1 via OPHTHALMIC

## 2024-03-22 MED ORDER — LACTATED RINGERS IV SOLN: INTRAVENOUS | Status: DC

## 2024-03-22 MED ORDER — TETRACAINE HCL 0.5 % OP SOLN
OPHTHALMIC | Status: AC
  Filled 2024-03-22: qty 4

## 2024-03-22 MED ORDER — TETRACAINE HCL 0.5 % OP SOLN
1.0000 [drp] | OPHTHALMIC | Status: DC | PRN
  Administered 2024-03-22 (×3): 1 [drp] via OPHTHALMIC

## 2024-03-22 MED ORDER — SIGHTPATH DOSE#1 BSS IO SOLN
INTRAOCULAR | Status: DC | PRN
Start: 2024-03-22 — End: 2024-03-22
  Administered 2024-03-22: 15 mL via INTRAOCULAR

## 2024-03-22 MED ORDER — SIGHTPATH DOSE#1 BSS IO SOLN
INTRAOCULAR | Status: DC | PRN
  Administered 2024-03-22: 54 mL via OPHTHALMIC

## 2024-03-22 MED ORDER — OXYCODONE HCL 5 MG PO TABS: 5.0000 mg | ORAL_TABLET | Freq: Once | ORAL | Status: DC | PRN

## 2024-03-22 MED ORDER — ONDANSETRON HCL 4 MG/2ML IJ SOLN
INTRAMUSCULAR | Status: DC | PRN
  Administered 2024-03-22: 4 mg via INTRAVENOUS

## 2024-03-22 MED ORDER — ACETAMINOPHEN 10 MG/ML IV SOLN: 1000.0000 mg | Freq: Once | INTRAVENOUS | Status: DC | PRN

## 2024-03-22 MED ORDER — ARMC OPHTHALMIC DILATING DROPS
OPHTHALMIC | Status: AC
  Filled 2024-03-22: qty 0.5

## 2024-03-22 MED ORDER — FENTANYL CITRATE PF 50 MCG/ML IJ SOSY: 25.0000 ug | PREFILLED_SYRINGE | INTRAMUSCULAR | Status: DC | PRN

## 2024-03-22 MED ORDER — ONDANSETRON HCL 4 MG/2ML IJ SOLN
INTRAMUSCULAR | Status: AC
Start: 2024-03-22 — End: 2024-03-22
  Filled 2024-03-22: qty 2

## 2024-03-22 SURGICAL SUPPLY — 11 items
CATARACT SUITE SIGHTPATH (MISCELLANEOUS) ×1 IMPLANT
FEE CATARACT SUITE SIGHTPATH (MISCELLANEOUS) ×1 IMPLANT
GLOVE BIOGEL PI IND STRL 8 (GLOVE) ×1 IMPLANT
GLOVE SURG LX STRL 7.5 STRW (GLOVE) ×1 IMPLANT
GLOVE SURG PROTEXIS BL SZ6.5 (GLOVE) ×1 IMPLANT
GLOVE SURG SYN 6.5 PF PI BL (GLOVE) ×1 IMPLANT
LENS IOL TECNIS EYHANCE 21.5 (Intraocular Lens) IMPLANT
NDL FILTER BLUNT 18X1 1/2 (NEEDLE) ×1 IMPLANT
NEEDLE FILTER BLUNT 18X1 1/2 (NEEDLE) ×1 IMPLANT
RING MALYGIN 7.0 (MISCELLANEOUS) IMPLANT
SYR 3ML LL SCALE MARK (SYRINGE) ×1 IMPLANT

## 2024-03-22 NOTE — H&P (Signed)
 [facility]   Primary Care Physician:  Ziglar, Devere POUR, MD Ophthalmologist: Dr. Dene Etienne  Pre-Procedure History & Physical: HPI:  Jeffery Ward is a 89 y.o. male here for ophthalmic surgery.   Past Medical History:  Diagnosis Date   Anxiety    Arthritis    back   Balanitis    BPH without obstruction/lower urinary tract symptoms    Coronary artery arteriosclerosis    Erectile dysfunction    GERD (gastroesophageal reflux disease)    Hearing difficulty    Hematuria    Hx of CABG    Hypogonadism in male    Kidney stones    Microscopic hematuria    Obesity    Primary hypertension    Urinary urgency     Past Surgical History:  Procedure Laterality Date   CATARACT EXTRACTION W/PHACO Left 03/08/2024   Procedure: PHACOEMULSIFICATION, CATARACT, WITH IOL INSERTION 7.14 00:43.5;  Surgeon: Etienne Dene, MD;  Location: [facility];  Service: Ophthalmology;  Laterality: Left;   CORONARY ARTERY BYPASS GRAFT  2011   CYSTOSCOPY WITH HOLMIUM LASER LITHOTRIPSY  12/09/2021   Procedure: CYSTOSCOPY WITH HOLMIUM LASER LITHOTRIPSY;  Surgeon: Twylla Glendia BROCKS, MD;  Location: [facility];  Service: Urology;;   CYSTOSCOPY WITH LITHOLAPAXY N/A 12/09/2021   Procedure: CYSTOSCOPY WITH LITHOLAPAXY;  Surgeon: Twylla Glendia BROCKS, MD;  Location: [facility];  Service: Urology;  Laterality: N/A;   CYSTOSCOPY WITH STENT PLACEMENT Left 11/11/2021   Procedure: CYSTOSCOPY WITH STENT PLACEMENT;  Surgeon: Twylla Glendia BROCKS, MD;  Location: [facility];  Service: Urology;  Laterality: Left;   CYSTOSCOPY/RETROGRADE/URETEROSCOPY/STONE EXTRACTION WITH BASKET  12/09/2021   Procedure: CYSTOSCOPY/RETROGRADE/URETEROSCOPY/STONE EXTRACTION WITH BASKET;  Surgeon: Twylla Glendia BROCKS, MD;  Location: [facility];  Service: Urology;;   LITHOTRIPSY      Prior to Admission medications   Medication Sig Start Date End Date Taking? Authorizing Provider  acetaminophen  (TYLENOL ) 650 MG CR tablet Take 650 mg by mouth  every 8 (eight) hours as needed for pain.   Yes [provider]  aspirin  81 MG EC tablet Take 81 mg by mouth daily. Swallow whole.   Yes [provider]  busPIRone  (BUSPAR ) 5 MG tablet Take 1 tablet (5 mg total) by mouth 3 (three) times daily. 01/11/24  Yes Ziglar, Susan K, MD  Cholecalciferol (D3-1000 PO) Take by mouth.   Yes [provider]  clopidogrel (PLAVIX) 75 MG tablet TAKE ONE TABLET EVERY DAY 04/13/23  Yes Fernand Alter A, MD  Coenzyme Q10 (COQ10) 200 MG CAPS Take 200 mg by mouth daily.   Yes [provider]  diphenhydramine-acetaminophen  (TYLENOL  PM) 25-500 MG TABS tablet Take 1 tablet by mouth at bedtime as needed.   Yes [provider]  ferrous sulfate  324 (65 Fe) MG TBEC Take by mouth.   Yes [provider]  fluticasone  (FLONASE ) 50 MCG/ACT nasal spray Place 2 sprays into both nostrils daily.   Yes [provider]  metoprolol  succinate (TOPROL -XL) 25 MG 24 hr tablet TAKE ONE TABLET BY MOUTH EVERY DAY 02/11/23  Yes Fernand Alter LABOR, MD  nystatin  cream (MYCOSTATIN ) Apply 1 Application topically 2 (two) times daily. 01/14/24  Yes Ziglar, Susan K, MD  pantoprazole  (PROTONIX ) 40 MG tablet TAKE 1 TABLET BY MOUTH DAILY 04/06/23  Yes Fernand Alter LABOR, MD  potassium citrate  (UROCIT-K ) 10 MEQ (1080 MG) SR tablet Take 1 tablet (10 mEq total) by mouth daily. 10/05/23  Yes Ziglar, Susan K, MD  rosuvastatin (CRESTOR) 20 MG tablet Take 20  mg by mouth at bedtime. 10/11/23  Yes [provider]  vitamin C (ASCORBIC ACID ) 250 MG tablet Take 500 mg by mouth daily.   Yes [provider]  zinc gluconate 50 MG tablet Take 50 mg by mouth daily.   Yes [provider]    Allergies as of 02/15/2024 - Review Complete 01/11/2024  Allergen Reaction Noted   Amoxicillin Other (See Comments) 06/06/2015   Azithromycin Diarrhea 06/06/2015   Penicillins Other (See Comments) 06/06/2015   Quinapril Other (See Comments) 01/07/2023     Family History  Problem Relation Age of Onset   Pneumonia Mother    Pneumonia Father    Kidney disease Neg Hx    Prostate cancer Neg Hx    Bladder Cancer Neg Hx     Social History   Socioeconomic History   Marital status: Married    Spouse name: Thelma   Number of children: 5   Years of education: Not on file   Highest education level: Not on file  Occupational History   Not on file  Tobacco Use   Smoking status: Never   Smokeless tobacco: Not on file  Vaping Use   Vaping status: Never Used  Substance and Sexual Activity   Alcohol use: No    Alcohol/week: 0.0 standard drinks of alcohol   Drug use: No   Sexual activity: Not on file  Other Topics Concern   Not on file  Social History Narrative   Not on file   Social Drivers of Health   Financial Resource Strain: Not on file  Food Insecurity: Not on file  Transportation Needs: Not on file  Physical Activity: Not on file  Stress: Not on file  Social Connections: Not on file  Intimate Partner Violence: Not on file    Review of Systems: See HPI, otherwise negative ROS  Physical Exam: BP (!) 131/97   Pulse 94   Temp 98.2 F (36.8 C) (Temporal)   Resp 20   Wt 103 kg   SpO2 94%   BMI 36.66 kg/m  General:   Alert,  pleasant and cooperative in NAD Head:  Normocephalic and atraumatic. Lungs:  Clear to auscultation.    Heart:  Regular rate and rhythm.   Impression/Plan: Jeffery Ward is here for ophthalmic surgery.  Risks, benefits, limitations, and alternatives regarding ophthalmic surgery have been reviewed with the patient.  Questions have been answered.  All parties agreeable.   MITTIE GASKIN, MD  03/22/2024, 10:41 AM

## 2024-03-22 NOTE — Transfer of Care (Signed)
 Immediate Anesthesia Transfer of Care Note  Patient: Jeffery Ward  Procedure(s) Performed: PHACOEMULSIFICATION, CATARACT, WITH IOL INSERTION 8.40 00:37.2 (Right: Eye)  Patient Location: PACU  Anesthesia Type: MAC  Level of Consciousness: awake, alert  and patient cooperative  Airway and Oxygen Therapy: Patient Spontanous Breathing and Patient connected to supplemental oxygen  Post-op Assessment: Post-op Vital signs reviewed, Patient's Cardiovascular Status Stable, Respiratory Function Stable, Patent Airway and No signs of Nausea or vomiting  Post-op Vital Signs: Reviewed and stable  Complications: No notable events documented.

## 2024-03-22 NOTE — Op Note (Signed)
 OPERATIVE NOTE  Jeffery Ward [id number] 03/22/2024   PREOPERATIVE DIAGNOSIS:    Nuclear Sclerotic Cataract Right eye with miotic pupil.        H25.11  POSTOPERATIVE DIAGNOSIS: Nuclear Sclerotic Cataract Right eye with miotic pupil.          PROCEDURE:  Phacoemusification with posterior chamber intraocular lens placement of the right eye which required pupil stretching with the Malyugin pupil expansion device. Ultrasound time: Procedure(s): PHACOEMULSIFICATION, CATARACT, WITH IOL INSERTION 8.40 00:37.2 (Right)  LENS:   Implant Name Type Inv. Item Serial No. Manufacturer Lot No. LRB No. Used Action  LENS IOL TECNIS EYHANCE 21.5 - [id number] Intraocular Lens LENS IOL TECNIS EYHANCE 21.5 [id number] [employer]  Right 1 Implanted      SURGEON:  Dene FABIENE Etienne, MD   ANESTHESIA:  Topical with tetracaine  drops and 2% Xylocaine  jelly, augmented with 1% preservative-free intracameral lidocaine .   COMPLICATIONS:  None.   DESCRIPTION OF PROCEDURE:  The patient was identified in the holding room and transported to the operating room and placed in the supine position under the operating microscope. The right eye was identified as the operative eye and it was prepped and draped in the usual sterile ophthalmic fashion.   A 1 millimeter clear-corneal paracentesis was made at the 12:00 position.  0.5 ml of preservative-free 1% lidocaine  was injected into the anterior chamber. The anterior chamber was filled with Viscoat viscoelastic.  A 2.4 millimeter keratome was used to make a near-clear corneal incision at the 9:00 position. A Malyugin pupil expander was then placed through the main incision and into the anterior chamber of the eye.  The edge of the iris was secured on the lip of the pupil expander and it was released, thereby expanding the pupil to approximately 7 millimeters for completion of the cataract surgery.  Additional Viscoat was placed in the anterior chamber.  A cystotome and  capsulorrhexis forceps were used to make a curvilinear capsulorrhexis.   Balanced salt  solution was used to hydrodissect and hydrodelineate the lens nucleus.   Phacoemulsification was used in stop and chop fashion to remove the lens, nucleus and epinucleus.  The remaining cortex was aspirated using the irrigation aspiration handpiece.  Additional Provisc was placed into the eye to distend the capsular bag for lens placement.  A lens was then injected into the capsular bag.  The pupil expanding ring was removed using a Kuglen hook and insertion device. The remaining viscoelastic was aspirated from the capsular bag and the anterior chamber.  The anterior chamber was filled with balanced salt  solution to inflate to a physiologic pressure.  Wounds were hydrated with balanced salt  solution.  The anterior chamber was inflated to a physiologic pressure with balanced salt  solution.  No wound leaks were noted.Vigamox  0.2 ml of a 1mg  per ml solution was injected into the anterior chamber for a dose of 0.2 mg of intracameral antibiotic at the completion of the case. Timolol  and Brimonidine  drops were applied to the eye.  The patient was taken to the recovery room in stable condition without complications of anesthesia or surgery.  [name] 03/22/2024, 11:53 AM

## 2024-03-28 ENCOUNTER — Other Ambulatory Visit: Payer: Self-pay | Admitting: Family Medicine

## 2024-03-28 MED ORDER — PANTOPRAZOLE SODIUM 40 MG PO TBEC: 40.0000 mg | DELAYED_RELEASE_TABLET | Freq: Every day | ORAL | 0 refills | Status: DC

## 2024-03-28 NOTE — Telephone Encounter (Signed)
 Copied from CRM [id number]. Topic: Clinical - Medication Refill >> Mar 28, 2024  1:48 PM Elle L wrote: Medication: pantoprazole  (PROTONIX ) 40 MG tablet  Has the patient contacted their pharmacy? Yes  This is the patient's preferred pharmacy:  [facility] - [place], [place] - [address] [place] [address] Phone: [phone] Fax: [phone]  Is this the correct pharmacy for this prescription? Yes  Has the prescription been filled recently? No  Is the patient out of the medication? No  Has the patient been seen for an appointment in the last year OR does the patient have an upcoming appointment? Yes  Can we respond through MyChart? Yes  Agent: Please be advised that Rx refills may take up to 3 business days. We ask that you follow-up with your pharmacy.

## 2024-04-03 NOTE — Anesthesia Postprocedure Evaluation (Signed)
 Anesthesia Post Note  Patient: Jeffery Ward  Procedure(s) Performed: PHACOEMULSIFICATION, CATARACT, WITH IOL INSERTION 8.40 00:37.2 (Right: Eye)  Patient location during evaluation: PACU Anesthesia Type: MAC Level of consciousness: awake and alert Pain management: pain level controlled Vital Signs Assessment: post-procedure vital signs reviewed and stable Respiratory status: spontaneous breathing, nonlabored ventilation, respiratory function stable and patient connected to nasal cannula oxygen Cardiovascular status: blood pressure returned to baseline and stable Postop Assessment: no apparent nausea or vomiting Anesthetic complications: no   No notable events documented.   Last Vitals:  Vitals:   03/22/24 1155 03/22/24 1200  BP: 120/83 126/88  Pulse: 79 68  Resp: 20 15  Temp: (!) 36.4 C (!) 36.4 C  SpO2: 98% 94%    Last Pain:  Vitals:   03/23/24 1124  TempSrc:   PainSc: 0-No pain                 Lynwood KANDICE Clause

## 2024-04-06 ENCOUNTER — Ambulatory Visit: Admitting: Cardiovascular Disease

## 2024-04-10 ENCOUNTER — Other Ambulatory Visit: Payer: Self-pay | Admitting: Cardiovascular Disease

## 2024-04-21 ENCOUNTER — Encounter: Payer: Self-pay | Admitting: Family Medicine

## 2024-04-21 ENCOUNTER — Ambulatory Visit (INDEPENDENT_AMBULATORY_CARE_PROVIDER_SITE_OTHER): Admitting: Family Medicine

## 2024-04-21 VITALS — BP 134/92 | HR 84 | Temp 98.4°F | Resp 18 | Ht 65.98 in | Wt 227.0 lb

## 2024-04-21 DIAGNOSIS — K219 Gastro-esophageal reflux disease without esophagitis: Secondary | ICD-10-CM

## 2024-04-21 DIAGNOSIS — R0602 Shortness of breath: Secondary | ICD-10-CM

## 2024-04-21 DIAGNOSIS — L84 Corns and callosities: Secondary | ICD-10-CM

## 2024-04-21 DIAGNOSIS — I2510 Atherosclerotic heart disease of native coronary artery without angina pectoris: Secondary | ICD-10-CM

## 2024-04-21 DIAGNOSIS — E782 Mixed hyperlipidemia: Secondary | ICD-10-CM

## 2024-04-21 DIAGNOSIS — B354 Tinea corporis: Secondary | ICD-10-CM | POA: Diagnosis not present

## 2024-04-21 DIAGNOSIS — E118 Type 2 diabetes mellitus with unspecified complications: Secondary | ICD-10-CM | POA: Diagnosis not present

## 2024-04-21 DIAGNOSIS — T7840XD Allergy, unspecified, subsequent encounter: Secondary | ICD-10-CM

## 2024-04-21 DIAGNOSIS — I10 Essential (primary) hypertension: Secondary | ICD-10-CM

## 2024-04-21 DIAGNOSIS — N200 Calculus of kidney: Secondary | ICD-10-CM | POA: Diagnosis not present

## 2024-04-21 DIAGNOSIS — F419 Anxiety disorder, unspecified: Secondary | ICD-10-CM | POA: Diagnosis not present

## 2024-04-21 DIAGNOSIS — E538 Deficiency of other specified B group vitamins: Secondary | ICD-10-CM

## 2024-04-21 LAB — POCT GLYCOSYLATED HEMOGLOBIN (HGB A1C): Hemoglobin A1C: 6.8 % — AB (ref 4.0–5.6)

## 2024-04-21 MED ORDER — PANTOPRAZOLE SODIUM 40 MG PO TBEC: 40.0000 mg | DELAYED_RELEASE_TABLET | Freq: Every day | ORAL | 0 refills | Status: AC

## 2024-04-21 MED ORDER — BUSPIRONE HCL 5 MG PO TABS: ORAL_TABLET | ORAL | 3 refills | Status: AC

## 2024-04-21 MED ORDER — POTASSIUM CITRATE ER 10 MEQ (1080 MG) PO TBCR: 10.0000 meq | EXTENDED_RELEASE_TABLET | Freq: Every day | ORAL | 3 refills | Status: AC

## 2024-04-21 MED ORDER — FLUTICASONE PROPIONATE 50 MCG/ACT NA SUSP
2.0000 | Freq: Every day | NASAL | 3 refills | Status: AC
Start: 2024-04-21 — End: ?

## 2024-04-21 MED ORDER — CLOPIDOGREL BISULFATE 75 MG PO TABS: 75.0000 mg | ORAL_TABLET | Freq: Every day | ORAL | 1 refills | Status: AC

## 2024-04-21 MED ORDER — METOPROLOL SUCCINATE ER 25 MG PO TB24: 25.0000 mg | ORAL_TABLET | Freq: Every day | ORAL | 0 refills | Status: AC

## 2024-04-21 MED ORDER — NYSTATIN 100000 UNIT/GM EX CREA: 1.0000 | TOPICAL_CREAM | Freq: Two times a day (BID) | CUTANEOUS | 1 refills | Status: AC

## 2024-04-21 MED ORDER — ROSUVASTATIN CALCIUM 20 MG PO TABS: 20.0000 mg | ORAL_TABLET | Freq: Every day | ORAL | 3 refills | Status: AC

## 2024-04-23 ENCOUNTER — Encounter: Payer: Self-pay | Admitting: Family Medicine

## 2024-04-24 ENCOUNTER — Other Ambulatory Visit: Payer: Self-pay | Admitting: Family Medicine

## 2024-04-24 ENCOUNTER — Encounter: Payer: Self-pay | Admitting: Family Medicine

## 2024-04-24 DIAGNOSIS — E118 Type 2 diabetes mellitus with unspecified complications: Secondary | ICD-10-CM

## 2024-04-24 DIAGNOSIS — I10 Essential (primary) hypertension: Secondary | ICD-10-CM

## 2024-04-24 NOTE — Progress Notes (Signed)
 Orders released so that patient can have labs drawn at [facility] per his request.

## 2024-04-25 DIAGNOSIS — R0602 Shortness of breath: Secondary | ICD-10-CM | POA: Insufficient documentation

## 2024-04-25 NOTE — Assessment & Plan Note (Signed)
 Goal is LDL below 70, 50 would be ideal.  Checking his cholesterol panel.  He takes Crestor  20 mg daily

## 2024-04-25 NOTE — Assessment & Plan Note (Signed)
 He is diet and exercise controlled with an A1c of 6.8%.  Has peripheral neuropathy of his hands and feet that is very painful.  Currently only feeling half of the monofilament on the bottom of each foot.  Also has calluses on his toes.  Refer him to podiatry

## 2024-04-25 NOTE — Assessment & Plan Note (Signed)
 He reports he gets winded very easily and has very little energy.  He needs to exercise.  Discussed going to cardiac rehab when he can exercise while he is monitored.  He is willing to do this.

## 2024-04-25 NOTE — Assessment & Plan Note (Signed)
 Will check his B12 levels.

## 2024-04-25 NOTE — Progress Notes (Addendum)
 Established Patient Office Visit  Subjective   Patient ID: Jeffery Ward, male    DOB: 1932-12-15  Age: 89 y.o. MRN: [id number]  Chief Complaint  Patient presents with   Medical Management of Chronic Issues    HPI Delightful 89 year old gentleman never smoker with BPH and LUTS, CAD s/p CABG, DMT2 (exercise and diet controlled), GERD, mixed hyperlipidemia, HTN, anxiety.   He is doing very well.  Gets out in his garden when he can.  He gets winded very easily but does not have chest pains.  He reports that he has very little energy. He has type 2 diabetes but controls it with his diet.  A1c today is 6.8 which is ideal.  He occasionally eats a Hershey bar.  And has a few other dietary indiscretions.  He feels good except for the constant pain in his feet and hands.  He takes Tylenol  for pain.  We discussed starting a low-dose gabapentin but one of the potential side effects was depression so he was not interested. He takes an 81 mg aspirin  daily.  Ask him to stop because the risk-benefit ratio is probably not in his favor any longer. Does not have a lot of energy and gives out pretty quickly.  Discussed cardiac rehab Discussed going to podiatry because he has calluses on his toes. Does see dermatologist annually. Had both his cataracts extracted and lens implants.  Better than he could before and colors are brighter.     ROS    Objective:     BP (!) 134/92 (BP Location: Left Arm, Patient Position: Sitting, Cuff Size: Normal)   Pulse 84   Temp 98.4 F (36.9 C) (Oral)   Resp 18   Ht 5' 5.98 (1.676 m)   Wt 227 lb (103 kg)   SpO2 93%   BMI 36.66 kg/m    Physical Exam Vitals and nursing note reviewed.  Constitutional:      Appearance: Normal appearance.  HENT:     Head: Normocephalic and atraumatic.  Eyes:     Conjunctiva/sclera: Conjunctivae normal.  Cardiovascular:     Rate and Rhythm: Normal rate and regular rhythm.     Pulses:          Dorsalis pedis pulses are 2+  on the right side and 2+ on the left side.       Posterior tibial pulses are 2+ on the right side and 2+ on the left side.  Pulmonary:     Effort: Pulmonary effort is normal.     Breath sounds: Normal breath sounds.  Musculoskeletal:     Right lower leg: No edema.     Left lower leg: No edema.  Feet:     Right foot:     Protective Sensation: 6 sites tested.  3 sites sensed.     Skin integrity: Callus present.     Toenail Condition: Right toenails are abnormally thick.     Left foot:     Protective Sensation: 6 sites tested.  3 sites sensed.     Skin integrity: Callus present.     Toenail Condition: Left toenails are abnormally thick.  Skin:    General: Skin is warm and dry.  Neurological:     Mental Status: He is alert and oriented to person, place, and time.  Psychiatric:        Mood and Affect: Mood normal.        Behavior: Behavior normal.        Thought Content:  Thought content normal.        Judgment: Judgment normal.          Results for orders placed or performed in visit on 04/21/24  POCT glycosylated hemoglobin (Hb A1C)  Result Value Ref Range   Hemoglobin A1C 6.8 (A) 4.0 - 5.6 %   HbA1c POC (<> result, manual entry)     HbA1c, POC (prediabetic range)     HbA1c, POC (controlled diabetic range)        The ASCVD Risk score (Arnett DK, et al., 2019) failed to calculate for the following reasons:   The 2019 ASCVD risk score is only valid for ages 54 to 55    Assessment & Plan:  Controlled type 2 diabetes mellitus with complication, without long-term current use of insulin (HCC) Assessment & Plan: He is diet and exercise controlled with an A1c of 6.8%.  Has peripheral neuropathy of his hands and feet that is very painful.  Currently only feeling half of the monofilament on the bottom of each foot.  Also has calluses on his toes.  Refer him to podiatry  Orders: -     Lipid panel; Future -     Microalbumin / creatinine urine ratio; Future -     POCT  glycosylated hemoglobin (Hb A1C)  Anxiety -     busPIRone  HCl; May take TID PRN anxiety  Dispense: 90 tablet; Refill: 3  Tinea corporis -     Nystatin ; Apply 1 Application topically 2 (two) times daily.  Dispense: 30 g; Refill: 1  Coronary artery disease involving native coronary artery of native heart without angina pectoris Assessment & Plan: Goal is LDL below 70, 50 would be ideal.  Checking his cholesterol panel.  He takes Crestor  20 mg daily  Orders: -     Clopidogrel  Bisulfate; Take 1 tablet (75 mg total) by mouth daily.  Dispense: 90 tablet; Refill: 1  Allergy, subsequent encounter -     Fluticasone  Propionate; Place 2 sprays into both nostrils daily.  Dispense: 11.1 mL; Refill: 3  Primary hypertension Assessment & Plan: Very mild increase in his blood pressure today at 134/92.  He is on metoprolol  succinate 25 mg daily.  May consider adding lisinopril 2.5 to 5 mg daily.  Will check urine microalbumin creatinine ratio  Orders: -     Metoprolol  Succinate ER; Take 1 tablet (25 mg total) by mouth daily.  Dispense: 90 tablet; Refill: 0 -     CBC with Differential/Platelet; Future -     Comprehensive metabolic panel with GFR; Future -     TSH + free T4; Future  Gastroesophageal reflux disease, unspecified whether esophagitis present -     Pantoprazole  Sodium; Take 1 tablet (40 mg total) by mouth daily.  Dispense: 90 tablet; Refill: 0  Mixed hyperlipidemia -     Rosuvastatin  Calcium ; Take 1 tablet (20 mg total) by mouth at bedtime.  Dispense: 90 tablet; Refill: 3  Kidney stones -     Potassium Citrate  ER; Take 1 tablet (10 mEq total) by mouth daily.  Dispense: 30 tablet; Refill: 3  SOB (shortness of breath) Assessment & Plan: He reports he gets winded very easily and has very little energy.  He needs to exercise.  Discussed going to cardiac rehab when he can exercise while he is monitored.  He is willing to do this.  Orders: -     AMB referral to cardiac  rehabilitation  Foot callus -     Ambulatory referral to  Podiatry  B12 deficiency Assessment & Plan: Will check his B12 levels.  Orders: -     Vitamin B12; Future     Return in about 3 months (around 07/22/2024) for A1c.    Faustine Tates K Joshus Rogan, MD

## 2024-04-25 NOTE — Assessment & Plan Note (Signed)
 Very mild increase in his blood pressure today at 134/92.  He is on metoprolol  succinate 25 mg daily.  May consider adding lisinopril 2.5 to 5 mg daily.  Will check urine microalbumin creatinine ratio

## 2024-04-27 ENCOUNTER — Telehealth: Payer: Self-pay | Admitting: Family Medicine

## 2024-04-27 DIAGNOSIS — R0602 Shortness of breath: Secondary | ICD-10-CM

## 2024-04-27 DIAGNOSIS — I2510 Atherosclerotic heart disease of native coronary artery without angina pectoris: Secondary | ICD-10-CM

## 2024-04-27 NOTE — Telephone Encounter (Signed)
 Spoke to [employer] and they confirmed that referral needs to go to Pulmonary Rehab not Cardiac for SOB on Exertion. I have updated referral and contacted daughter she is aware we must start with Pulm Rehab and may end up at Cardiac Rehab.

## 2024-04-27 NOTE — Telephone Encounter (Signed)
 Copied from CRM [id number]. Topic: Referral - Status >> Apr 27, 2024 11:09 AM Elle L wrote: Reason for CRM: The patient's daughter states she reached out to the [facility] that the patient was referred to and they advised that they did not receive the referral or order for walking therapy for the patient. Her call back number is [phone].   The referral is in the [facility] workquere. [phone]. There is no order for walking therapy. Referral and notes sent but no orders. Can you assist?

## 2024-04-28 DIAGNOSIS — I10 Essential (primary) hypertension: Secondary | ICD-10-CM | POA: Diagnosis not present

## 2024-04-28 DIAGNOSIS — E118 Type 2 diabetes mellitus with unspecified complications: Secondary | ICD-10-CM | POA: Diagnosis not present

## 2024-04-28 NOTE — Addendum Note (Signed)
 Addended by: Juanmanuel Marohl K on: 04/28/2024 12:19 PM   Modules accepted: Orders

## 2024-04-29 LAB — LIPID PANEL
Chol/HDL Ratio: 2.6 ratio (ref 0.0–5.0)
Cholesterol, Total: 164 mg/dL (ref 100–199)
HDL: 64 mg/dL (ref >39)
LDL Chol Calc (NIH): 86 mg/dL (ref 0–99)
Triglycerides: 74 mg/dL (ref 0–149)
VLDL Cholesterol Cal: 14 mg/dL (ref 5–40)

## 2024-04-29 LAB — COMPREHENSIVE METABOLIC PANEL WITH GFR
ALT: 22 IU/L (ref 0–44)
AST: 23 IU/L (ref 0–40)
Albumin: 4.4 g/dL (ref 3.7–4.7)
Alkaline Phosphatase: 41 IU/L — ABNORMAL LOW (ref 44–121)
BUN/Creatinine Ratio: 14 (ref 10–24)
BUN: 16 mg/dL (ref 8–27)
Bilirubin Total: 0.6 mg/dL (ref 0.0–1.2)
CO2: 21 mmol/L (ref 20–29)
Calcium: 9.6 mg/dL (ref 8.6–10.2)
Chloride: 103 mmol/L (ref 96–106)
Creatinine, Ser: 1.13 mg/dL (ref 0.76–1.27)
Globulin, Total: 1.8 g/dL (ref 1.5–4.5)
Glucose: 122 mg/dL — ABNORMAL HIGH (ref 70–99)
Potassium: 4.2 mmol/L (ref 3.5–5.2)
Sodium: 140 mmol/L (ref 134–144)
Total Protein: 6.2 g/dL (ref 6.0–8.5)
eGFR: 63 mL/min/1.73 (ref >59)

## 2024-04-29 LAB — CBC WITH DIFFERENTIAL/PLATELET
Basophils Absolute: 0 x10E3/uL (ref 0.0–0.2)
Basos: 1 %
EOS (ABSOLUTE): 0.1 x10E3/uL (ref 0.0–0.4)
Eos: 2 %
Hematocrit: 42 % (ref 37.5–51.0)
Hemoglobin: 14.1 g/dL (ref 13.0–17.7)
Immature Grans (Abs): 0 x10E3/uL (ref 0.0–0.1)
Immature Granulocytes: 0 %
Lymphocytes Absolute: 2.1 x10E3/uL (ref 0.7–3.1)
Lymphs: 34 %
MCH: 32.8 pg (ref 26.6–33.0)
MCHC: 33.6 g/dL (ref 31.5–35.7)
MCV: 98 fL — ABNORMAL HIGH (ref 79–97)
Monocytes Absolute: 0.5 x10E3/uL (ref 0.1–0.9)
Monocytes: 8 %
Neutrophils Absolute: 3.4 x10E3/uL (ref 1.4–7.0)
Neutrophils: 54 %
Platelets: 198 x10E3/uL (ref 150–450)
RBC: 4.3 x10E6/uL (ref 4.14–5.80)
RDW: 13 % (ref 11.6–15.4)
WBC: 6.3 x10E3/uL (ref 3.4–10.8)

## 2024-04-29 LAB — TSH+FREE T4
Free T4: 1.06 ng/dL (ref 0.82–1.77)
TSH: 2.05 u[IU]/mL (ref 0.450–4.500)

## 2024-04-29 LAB — MICROALBUMIN / CREATININE URINE RATIO
Creatinine, Urine: 126.6 mg/dL
Microalb/Creat Ratio: 8 mg/g{creat} (ref 0–29)
Microalbumin, Urine: 10.2 ug/mL

## 2024-05-01 ENCOUNTER — Ambulatory Visit: Payer: Self-pay | Admitting: Family Medicine

## 2024-05-04 ENCOUNTER — Encounter: Payer: Self-pay | Admitting: Cardiovascular Disease

## 2024-05-04 ENCOUNTER — Ambulatory Visit: Admitting: Cardiovascular Disease

## 2024-05-04 VITALS — BP 130/72 | HR 70 | Ht 65.0 in | Wt 228.2 lb

## 2024-05-04 DIAGNOSIS — Z951 Presence of aortocoronary bypass graft: Secondary | ICD-10-CM | POA: Diagnosis not present

## 2024-05-04 DIAGNOSIS — R0602 Shortness of breath: Secondary | ICD-10-CM

## 2024-05-04 DIAGNOSIS — I10 Essential (primary) hypertension: Secondary | ICD-10-CM

## 2024-05-04 DIAGNOSIS — I2510 Atherosclerotic heart disease of native coronary artery without angina pectoris: Secondary | ICD-10-CM | POA: Diagnosis not present

## 2024-05-04 NOTE — Progress Notes (Signed)
 Cardiology Office Note   Date:  05/04/2024   ID:  KAVEH KISSINGER, DOB 03-Apr-1933, MRN [id number]  PCP:  Ziglar, Susan K, MD  Cardiologist:  Denyse Bathe, MD      History of Present Illness: Jeffery Ward is a 89 y.o. male who presents for  Chief Complaint  Patient presents with   Follow-up    Just making sure dad's heart is okay.    Has occasional SOB. Missed appointment as fell.      Past Medical History:  Diagnosis Date   Anxiety    Arthritis    back   Balanitis    BPH without obstruction/lower urinary tract symptoms    Coronary artery arteriosclerosis    Erectile dysfunction    GERD (gastroesophageal reflux disease)    Hearing difficulty    Hematuria    Hx of CABG    Hypogonadism in male    Kidney stones    Microscopic hematuria    Obesity    Primary hypertension    Urinary urgency      Past Surgical History:  Procedure Laterality Date   CATARACT EXTRACTION W/PHACO Left 03/08/2024   Procedure: PHACOEMULSIFICATION, CATARACT, WITH IOL INSERTION 7.14 00:43.5;  Surgeon: Mittie Gaskin, MD;  Location: [facility];  Service: Ophthalmology;  Laterality: Left;   CATARACT EXTRACTION W/PHACO Right 03/22/2024   Procedure: PHACOEMULSIFICATION, CATARACT, WITH IOL INSERTION 8.40 00:37.2;  Surgeon: Mittie Gaskin, MD;  Location: [facility];  Service: Ophthalmology;  Laterality: Right;   CORONARY ARTERY BYPASS GRAFT  2011   CYSTOSCOPY WITH HOLMIUM LASER LITHOTRIPSY  12/09/2021   Procedure: CYSTOSCOPY WITH HOLMIUM LASER LITHOTRIPSY;  Surgeon: Twylla Glendia BROCKS, MD;  Location: [facility];  Service: Urology;;   CYSTOSCOPY WITH LITHOLAPAXY N/A 12/09/2021   Procedure: CYSTOSCOPY WITH LITHOLAPAXY;  Surgeon: Twylla Glendia BROCKS, MD;  Location: [facility];  Service: Urology;  Laterality: N/A;   CYSTOSCOPY WITH STENT PLACEMENT Left 11/11/2021   Procedure: CYSTOSCOPY WITH STENT PLACEMENT;  Surgeon: Twylla Glendia BROCKS, MD;  Location: [facility];  Service: Urology;   Laterality: Left;   CYSTOSCOPY/RETROGRADE/URETEROSCOPY/STONE EXTRACTION WITH BASKET  12/09/2021   Procedure: CYSTOSCOPY/RETROGRADE/URETEROSCOPY/STONE EXTRACTION WITH BASKET;  Surgeon: Twylla Glendia BROCKS, MD;  Location: [facility];  Service: Urology;;   LITHOTRIPSY       Current Outpatient Medications  Medication Sig Dispense Refill   acetaminophen  (TYLENOL ) 650 MG CR tablet Take 650 mg by mouth every 8 (eight) hours as needed for pain.     busPIRone  (BUSPAR ) 5 MG tablet May take TID PRN anxiety 90 tablet 3   Cholecalciferol (D3-1000 PO) Take by mouth.     clopidogrel  (PLAVIX ) 75 MG tablet Take 1 tablet (75 mg total) by mouth daily. 90 tablet 1   Coenzyme Q10 (COQ10) 200 MG CAPS Take 200 mg by mouth daily.     diphenhydramine-acetaminophen  (TYLENOL  PM) 25-500 MG TABS tablet Take 1 tablet by mouth at bedtime as needed.     ferrous sulfate  324 (65 Fe) MG TBEC Take by mouth.     fluticasone  (FLONASE ) 50 MCG/ACT nasal spray Place 2 sprays into both nostrils daily. 11.1 mL 3   metoprolol  succinate (TOPROL -XL) 25 MG 24 hr tablet Take 1 tablet (25 mg total) by mouth daily. 90 tablet 0   nystatin  cream (MYCOSTATIN ) Apply 1 Application topically 2 (two) times daily. 30 g 1   pantoprazole  (PROTONIX ) 40 MG tablet Take 1 tablet (40 mg total) by mouth daily. 90 tablet 0   potassium citrate  (UROCIT-K )  10 MEQ (1080 MG) SR tablet Take 1 tablet (10 mEq total) by mouth daily. 30 tablet 3   rosuvastatin  (CRESTOR ) 20 MG tablet Take 1 tablet (20 mg total) by mouth at bedtime. 90 tablet 3   vitamin C (ASCORBIC ACID ) 250 MG tablet Take 500 mg by mouth daily.     zinc gluconate 50 MG tablet Take 50 mg by mouth daily.     aspirin  81 MG EC tablet Take 81 mg by mouth daily. Swallow whole. (Patient not taking: Reported on 05/04/2024)     No current facility-administered medications for this visit.    Allergies:   Amoxicillin, Azithromycin, Penicillins, and Quinapril    Social History:   reports that he has never  smoked. He has never been exposed to tobacco smoke. He does not have any smokeless tobacco history on file. He reports that he does not drink alcohol and does not use drugs.   Family History:  family history includes Pneumonia in his father and mother.    ROS:     Review of Systems  Constitutional: Negative.   HENT: Negative.    Eyes: Negative.   Respiratory: Negative.    Gastrointestinal: Negative.   Genitourinary: Negative.   Musculoskeletal: Negative.   Skin: Negative.   Neurological: Negative.   Endo/Heme/Allergies: Negative.   Psychiatric/Behavioral: Negative.    All other systems reviewed and are negative.     All other systems are reviewed and negative.    PHYSICAL EXAM: VS:  BP 130/72   Pulse 70   Ht 5' 5 (1.651 m)   Wt 228 lb 3.2 oz (103.5 kg)   SpO2 96%   BMI 37.97 kg/m  , BMI Body mass index is 37.97 kg/m. Last weight:  Wt Readings from Last 3 Encounters:  05/04/24 228 lb 3.2 oz (103.5 kg)  04/21/24 227 lb (103 kg)  03/22/24 227 lb (103 kg)     Physical Exam Vitals reviewed.  Constitutional:      Appearance: Normal appearance. He is normal weight.  HENT:     Head: Normocephalic.     Nose: Nose normal.     Mouth/Throat:     Mouth: Mucous membranes are moist.  Eyes:     Pupils: Pupils are equal, round, and reactive to light.  Cardiovascular:     Rate and Rhythm: Normal rate and regular rhythm.     Pulses: Normal pulses.     Heart sounds: Normal heart sounds.  Pulmonary:     Effort: Pulmonary effort is normal.  Abdominal:     General: Abdomen is flat. Bowel sounds are normal.  Musculoskeletal:        General: Normal range of motion.     Cervical back: Normal range of motion.  Skin:    General: Skin is warm.  Neurological:     General: No focal deficit present.     Mental Status: He is alert.  Psychiatric:        Mood and Affect: Mood normal.       EKG:   Recent Labs: 04/28/2024: ALT 22; BUN 16; Creatinine, Ser 1.13; Hemoglobin 14.1;  Platelets 198; Potassium 4.2; Sodium 140; TSH 2.050    Lipid Panel    Component Value Date/Time   CHOL 164 04/28/2024 0911   TRIG 74 04/28/2024 0911   HDL 64 04/28/2024 0911   CHOLHDL 2.6 04/28/2024 0911   LDLCALC 86 04/28/2024 0911      Other studies Reviewed: Additional studies/ records that were reviewed today include:  Review of the above records demonstrates:       No data to display            ASSESSMENT AND PLAN:    ICD-10-CM   1. Hx of CABG  Z95.1    No chest pain, but has occasional SOB. Being set up foir [facility]. Had episode of syncope.    2. Primary hypertension  I10     3. Coronary artery disease involving native coronary artery of native heart without angina pectoris  I25.10     4. SOB (shortness of breath)  R06.02    Its getting better, fell as lost balance.       Problem List Items Addressed This Visit       Cardiovascular and Mediastinum   Coronary artery disease involving native coronary artery of native heart   Primary hypertension     Other   Hx of CABG - Primary   SOB (shortness of breath)       Disposition:   Return in about 2 months (around 07/04/2024).    Total time spent: 30 minutes  Signed,  Denyse Bathe, MD  05/04/2024 11:13 AM    [facility]

## 2024-05-09 ENCOUNTER — Ambulatory Visit: Admitting: Podiatry

## 2024-05-09 DIAGNOSIS — L84 Corns and callosities: Secondary | ICD-10-CM

## 2024-05-09 NOTE — Progress Notes (Signed)
 Subjective:  Patient ID: Jeffery Ward, male    DOB: [birth year],  MRN: [id number]  Chief Complaint  Patient presents with   Callouses    89 y.o. male presents with the above complaint.  Patient presents with left second digit heloma molle painful to touch.  He wanted to get it evaluated he has a white foot structure has not seen anyone as prior to seeing me for this..  He would like to discuss treatment options for this he does not any ulcers at this time   Review of Systems: Negative except as noted in the HPI. Denies N/V/F/Ch.  Past Medical History:  Diagnosis Date   Anxiety    Arthritis    back   Balanitis    BPH without obstruction/lower urinary tract symptoms    Coronary artery arteriosclerosis    Erectile dysfunction    GERD (gastroesophageal reflux disease)    Hearing difficulty    Hematuria    Hx of CABG    Hypogonadism in male    Kidney stones    Microscopic hematuria    Obesity    Primary hypertension    Urinary urgency     Current Outpatient Medications:    acetaminophen  (TYLENOL ) 650 MG CR tablet, Take 650 mg by mouth every 8 (eight) hours as needed for pain., Disp: , Rfl:    aspirin  81 MG EC tablet, Take 81 mg by mouth daily. Swallow whole. (Patient not taking: Reported on 05/04/2024), Disp: , Rfl:    busPIRone  (BUSPAR ) 5 MG tablet, May take TID PRN anxiety, Disp: 90 tablet, Rfl: 3   Cholecalciferol (D3-1000 PO), Take by mouth., Disp: , Rfl:    clopidogrel  (PLAVIX ) 75 MG tablet, Take 1 tablet (75 mg total) by mouth daily., Disp: 90 tablet, Rfl: 1   Coenzyme Q10 (COQ10) 200 MG CAPS, Take 200 mg by mouth daily., Disp: , Rfl:    diphenhydramine-acetaminophen  (TYLENOL  PM) 25-500 MG TABS tablet, Take 1 tablet by mouth at bedtime as needed., Disp: , Rfl:    ferrous sulfate  324 (65 Fe) MG TBEC, Take by mouth., Disp: , Rfl:    fluticasone  (FLONASE ) 50 MCG/ACT nasal spray, Place 2 sprays into both nostrils daily., Disp: 11.1 mL, Rfl: 3   metoprolol  succinate  (TOPROL -XL) 25 MG 24 hr tablet, Take 1 tablet (25 mg total) by mouth daily., Disp: 90 tablet, Rfl: 0   nystatin  cream (MYCOSTATIN ), Apply 1 Application topically 2 (two) times daily., Disp: 30 g, Rfl: 1   pantoprazole  (PROTONIX ) 40 MG tablet, Take 1 tablet (40 mg total) by mouth daily., Disp: 90 tablet, Rfl: 0   potassium citrate  (UROCIT-K ) 10 MEQ (1080 MG) SR tablet, Take 1 tablet (10 mEq total) by mouth daily., Disp: 30 tablet, Rfl: 3   rosuvastatin  (CRESTOR ) 20 MG tablet, Take 1 tablet (20 mg total) by mouth at bedtime., Disp: 90 tablet, Rfl: 3   vitamin C (ASCORBIC ACID ) 250 MG tablet, Take 500 mg by mouth daily., Disp: , Rfl:    zinc gluconate 50 MG tablet, Take 50 mg by mouth daily., Disp: , Rfl:   Social History   Tobacco Use  Smoking Status Never   Passive exposure: Never  Smokeless Tobacco Not on file    Allergies  Allergen Reactions   Amoxicillin Other (See Comments)   Azithromycin Diarrhea   Penicillins Other (See Comments)   Quinapril Other (See Comments)   Objective:  There were no vitals filed for this visit. There is no height or weight on file  to calculate BMI. Constitutional Well developed. Well nourished.  Vascular Dorsalis pedis pulses palpable bilaterally. Posterior tibial pulses palpable bilaterally. Capillary refill normal to all digits.  No cyanosis or clubbing noted. Pedal hair growth normal.  Neurologic Normal speech. Oriented to person, place, and time. Epicritic sensation to light touch grossly present bilaterally.  Dermatologic Left second digit heloma molle.  No open wounds or lesion noted.  Mild central nucleated core noted.  Hammertoe contracture of second digit noted.  Orthopedic: Normal joint ROM without pain or crepitus bilaterally. No visible deformities. No bony tenderness.   Radiographs: None Assessment:   1. Heloma molle    Plan:  Patient was evaluated and treated and all questions answered.  Left second digit heloma molle - All  questions and concerns were discussed with the patient in extensive detail given the present heloma molle benefit from debridement of the lesion using chisel blade annular lesion was debrided to healthy stripe tissue no complication noted no pinpoint bleeding noted - Shoe gear modification discussed - Toe spacers were dispensed  No follow-ups on file.

## 2024-05-10 ENCOUNTER — Encounter: Attending: Family Medicine

## 2024-05-10 ENCOUNTER — Other Ambulatory Visit: Payer: Self-pay

## 2024-05-10 DIAGNOSIS — R0602 Shortness of breath: Secondary | ICD-10-CM | POA: Insufficient documentation

## 2024-05-10 NOTE — Progress Notes (Signed)
 Virtual Visit completed. Patient informed on EP and RD appointment and 6 Minute walk test. Patient also informed of patient health questionnaires on My Chart. Patient Verbalizes understanding. Visit diagnosis can be found in [facility] 01/11/2024.

## 2024-05-11 ENCOUNTER — Ambulatory Visit

## 2024-05-17 ENCOUNTER — Encounter

## 2024-05-17 VITALS — Ht 66.61 in | Wt 227.4 lb

## 2024-05-17 DIAGNOSIS — R0602 Shortness of breath: Secondary | ICD-10-CM

## 2024-05-17 NOTE — Progress Notes (Signed)
 Pulmonary Individual Treatment Plan  Patient Details  Name: BARNEY RUSSOMANNO MRN: [id number] Date of Birth: 1933-06-12 Referring Provider:   Flowsheet Row Pulmonary Rehab from 05/17/2024 in [facility] Cardiac and Pulmonary Rehab  Referring Provider Onita Pulling, MD    Initial Encounter Date:  Flowsheet Row Pulmonary Rehab from 05/17/2024 in [facility] Cardiac and Pulmonary Rehab  Date 05/17/24    Visit Diagnosis: Shortness of breath  Patient's Home Medications on Admission:  Current Outpatient Medications:    acetaminophen  (TYLENOL ) 650 MG CR tablet, Take 650 mg by mouth every 8 (eight) hours as needed for pain., Disp: , Rfl:    aspirin  81 MG EC tablet, Take 81 mg by mouth daily. Swallow whole. (Patient not taking: Reported on 05/04/2024), Disp: , Rfl:    busPIRone  (BUSPAR ) 5 MG tablet, May take TID PRN anxiety, Disp: 90 tablet, Rfl: 3   Cholecalciferol (D3-1000 PO), Take by mouth., Disp: , Rfl:    clopidogrel  (PLAVIX ) 75 MG tablet, Take 1 tablet (75 mg total) by mouth daily., Disp: 90 tablet, Rfl: 1   Coenzyme Q10 (COQ10) 200 MG CAPS, Take 200 mg by mouth daily., Disp: , Rfl:    diphenhydramine-acetaminophen  (TYLENOL  PM) 25-500 MG TABS tablet, Take 1 tablet by mouth at bedtime as needed., Disp: , Rfl:    ferrous sulfate  324 (65 Fe) MG TBEC, Take by mouth., Disp: , Rfl:    fluticasone  (FLONASE ) 50 MCG/ACT nasal spray, Place 2 sprays into both nostrils daily., Disp: 11.1 mL, Rfl: 3   ketorolac  (ACULAR ) 0.5 % ophthalmic solution, Place 1 drop into the right eye 4 (four) times daily., Disp: , Rfl:    metoprolol  succinate (TOPROL -XL) 25 MG 24 hr tablet, Take 1 tablet (25 mg total) by mouth daily., Disp: 90 tablet, Rfl: 0   moxifloxacin  (VIGAMOX ) 0.5 % ophthalmic solution, Place 1 drop into the right eye 4 (four) times daily., Disp: , Rfl:    nystatin  cream (MYCOSTATIN ), Apply 1 Application topically 2 (two) times daily., Disp: 30 g, Rfl: 1   pantoprazole  (PROTONIX ) 40 MG tablet, Take 1 tablet (40 mg  total) by mouth daily., Disp: 90 tablet, Rfl: 0   potassium citrate  (UROCIT-K ) 10 MEQ (1080 MG) SR tablet, Take 1 tablet (10 mEq total) by mouth daily., Disp: 30 tablet, Rfl: 3   prednisoLONE acetate (PRED FORTE) 1 % ophthalmic suspension, Place 1 drop into the right eye 4 (four) times daily., Disp: , Rfl:    rosuvastatin  (CRESTOR ) 20 MG tablet, Take 1 tablet (20 mg total) by mouth at bedtime., Disp: 90 tablet, Rfl: 3   vitamin C (ASCORBIC ACID ) 250 MG tablet, Take 500 mg by mouth daily., Disp: , Rfl:    zinc gluconate 50 MG tablet, Take 50 mg by mouth daily., Disp: , Rfl:   Past Medical History: Past Medical History:  Diagnosis Date   Anxiety    Arthritis    back   Balanitis    BPH without obstruction/lower urinary tract symptoms    Coronary artery arteriosclerosis    Erectile dysfunction    GERD (gastroesophageal reflux disease)    Hearing difficulty    Hematuria    Hx of CABG    Hypogonadism in male    Kidney stones    Microscopic hematuria    Obesity    Primary hypertension    Urinary urgency     Tobacco Use: Social History   Tobacco Use  Smoking Status Never   Passive exposure: Never  Smokeless Tobacco Not on file  Labs: Review Flowsheet  More data may exist      Latest Ref Rng & Units 10/03/2015 10/12/2023 01/11/2024 04/21/2024 04/28/2024  Labs for ITP Cardiac and Pulmonary Rehab  Cholestrol 100 - 199 mg/dL 865  861  851  - 835   LDL (calc) 0 - 99 mg/dL 66  60  66  - 86   HDL-C >39 mg/dL 54  52  57  - 64   Trlycerides 0 - 149 mg/dL 69  843  853  - 74   Hemoglobin A1c 4.0 - 5.6 % - 6.9  6.8  6.8  -     Pulmonary Assessment Scores:  Pulmonary Assessment Scores     Row Name 05/17/24 1555         mMRC Score   mMRC Score 2        UCSD: Self-administered rating of dyspnea associated with activities of daily living (ADLs) 6-point scale (0 = not at all to 5 = maximal or unable to do because of breathlessness)  Scoring Scores range from 0 to 120.   Minimally important difference is 5 units  CAT: CAT can identify the health impairment of COPD patients and is better correlated with disease progression.  CAT has a scoring range of zero to 40. The CAT score is classified into four groups of low (less than 10), medium (10 - 20), high (21-30) and very high (31-40) based on the impact level of disease on health status. A CAT score over 10 suggests significant symptoms.  A worsening CAT score could be explained by an exacerbation, poor medication adherence, poor inhaler technique, or progression of COPD or comorbid conditions.  CAT MCID is 2 points  mMRC: mMRC (Modified Medical Research Council) Dyspnea Scale is used to assess the degree of baseline functional disability in patients of respiratory disease due to dyspnea. No minimal important difference is established. A decrease in score of 1 point or greater is considered a positive change.   Pulmonary Function Assessment:   Exercise Target Goals: Exercise Program Goal: Individual exercise prescription set using results from initial 6 min walk test and THRR while considering  patient's activity barriers and safety.   Exercise Prescription Goal: Initial exercise prescription builds to 30-45 minutes a day of aerobic activity, 2-3 days per week.  Home exercise guidelines will be given to patient during program as part of exercise prescription that the participant will acknowledge.  Education: Aerobic Exercise: - Group verbal and visual presentation on the components of exercise prescription. Introduces F.I.T.T principle from ACSM for exercise prescriptions.  Reviews F.I.T.T. principles of aerobic exercise including progression. Written material provided at class time.   Education: Resistance Exercise: - Group verbal and visual presentation on the components of exercise prescription. Introduces F.I.T.T principle from ACSM for exercise prescriptions  Reviews F.I.T.T. principles of resistance  exercise including progression. Written material provided at class time.    Education: Exercise & Equipment Safety: - Individual verbal instruction and demonstration of equipment use and safety with use of the equipment. Flowsheet Row Pulmonary Rehab from 05/17/2024 in [facility] Cardiac and Pulmonary Rehab  Date 05/17/24  Educator MB  Instruction Review Code 1- Verbalizes Understanding    Education: Exercise Physiology & General Exercise Guidelines: - Group verbal and written instruction with models to review the exercise physiology of the cardiovascular system and associated critical values. Provides general exercise guidelines with specific guidelines to those with heart or lung disease.    Education: Flexibility, Balance, Mind/Body Relaxation: - Group verbal  and visual presentation with interactive activity on the components of exercise prescription. Introduces F.I.T.T principle from ACSM for exercise prescriptions. Reviews F.I.T.T. principles of flexibility and balance exercise training including progression. Also discusses the mind body connection.  Reviews various relaxation techniques to help reduce and manage stress (i.e. Deep breathing, progressive muscle relaxation, and visualization). Balance handout provided to take home. Written material provided at class time.   Activity Barriers & Risk Stratification:  Activity Barriers & Cardiac Risk Stratification - 05/17/24 1551       Activity Barriers & Cardiac Risk Stratification   Activity Barriers Arthritis;Assistive Device          6 Minute Walk:  6 Minute Walk     Row Name 05/17/24 1549         6 Minute Walk   Phase Initial     Distance 610 feet     Walk Time 6 minutes     # of Rest Breaks 0     MPH 1.16     METS 0.14     RPE 10     Perceived Dyspnea  0     VO2 Peak 0.5     Symptoms No     Resting HR 68 bpm     Resting BP 128/70     Resting Oxygen Saturation  94 %     Exercise Oxygen Saturation  during 6 min walk  92 %     Max Ex. HR 84 bpm     Max Ex. BP 144/80     2 Minute Post BP 138/62       Interval HR   1 Minute HR 73     2 Minute HR 66     3 Minute HR 80     4 Minute HR 75     5 Minute HR 84     6 Minute HR 75     2 Minute Post HR 69     Interval Heart Rate? Yes       Interval Oxygen   Interval Oxygen? Yes     Baseline Oxygen Saturation % 94 %     1 Minute Oxygen Saturation % 94 %     1 Minute Liters of Oxygen 0 L     2 Minute Oxygen Saturation % 93 %     2 Minute Liters of Oxygen 0 L     3 Minute Oxygen Saturation % 92 %     3 Minute Liters of Oxygen 0 L     4 Minute Oxygen Saturation % 92 %     4 Minute Liters of Oxygen 0 L     5 Minute Oxygen Saturation % 93 %     5 Minute Liters of Oxygen 0 L     6 Minute Oxygen Saturation % 93 %     6 Minute Liters of Oxygen 0 L     2 Minute Post Oxygen Saturation % 94 %     2 Minute Post Liters of Oxygen 0 L       Oxygen Initial Assessment:   Oxygen Re-Evaluation:   Oxygen Discharge (Final Oxygen Re-Evaluation):   Initial Exercise Prescription:  Initial Exercise Prescription - 05/17/24 1500       Date of Initial Exercise RX and Referring Provider   Date 05/17/24    Referring Provider Ziglar, Susan, MD      Oxygen   Maintain Oxygen Saturation 88% or higher      Recumbant Bike  Level 1    RPM 50    Watts 15    Minutes 15    METs 0.14      NuStep   Level 1    SPM 80    Minutes 15    METs 0.14      Biostep-RELP   Level 1    SPM 50    Minutes 15    METs 0.14      Track   Laps 12    Minutes 15    METs 1.65      Prescription Details   Frequency (times per week) 2    Duration Progress to 30 minutes of continuous aerobic without signs/symptoms of physical distress      Intensity   THRR 40-80% of Max Heartrate 93-119    Ratings of Perceived Exertion 11-13    Perceived Dyspnea 0-4      Progression   Progression Continue to progress workloads to maintain intensity without signs/symptoms of physical  distress.      Resistance Training   Training Prescription Yes    Weight 3 lb    Reps 10-15          Perform Capillary Blood Glucose checks as needed.  Exercise Prescription Changes:   Exercise Prescription Changes     Row Name 05/17/24 1500             Response to Exercise   Blood Pressure (Admit) 128/70       Blood Pressure (Exercise) 144/80       Blood Pressure (Exit) 138/62       Heart Rate (Admit) 68 bpm       Heart Rate (Exercise) 84 bpm       Heart Rate (Exit) 69 bpm       Oxygen Saturation (Admit) 94 %       Oxygen Saturation (Exercise) 92 %       Oxygen Saturation (Exit) 94 %       Rating of Perceived Exertion (Exercise) 10       Perceived Dyspnea (Exercise) 0       Symptoms none       Comments results         Progression   Average METs 0.14          Exercise Comments:   Exercise Goals and Review:   Exercise Goals     Row Name 05/17/24 1554             Exercise Goals   Increase Physical Activity Yes       Intervention Provide advice, education, support and counseling about physical activity/exercise needs.;Develop an individualized exercise prescription for aerobic and resistive training based on initial evaluation findings, risk stratification, comorbidities and participant's personal goals.       Expected Outcomes Short Term: Attend rehab on a regular basis to increase amount of physical activity.;Long Term: Add in home exercise to make exercise part of routine and to increase amount of physical activity.;Long Term: Exercising regularly at least 3-5 days a week.       Increase Strength and Stamina Yes       Intervention Provide advice, education, support and counseling about physical activity/exercise needs.;Develop an individualized exercise prescription for aerobic and resistive training based on initial evaluation findings, risk stratification, comorbidities and participant's personal goals.       Expected Outcomes Short Term: Increase  workloads from initial exercise prescription for resistance, speed, and METs.;Short Term: Perform resistance training exercises  routinely during rehab and add in resistance training at home;Long Term: Improve cardiorespiratory fitness, muscular endurance and strength as measured by increased METs and functional capacity ( )       Able to understand and use rate of perceived exertion (RPE) scale Yes       Intervention Provide education and explanation on how to use RPE scale       Expected Outcomes Short Term: Able to use RPE daily in rehab to express subjective intensity level;Long Term:  Able to use RPE to guide intensity level when exercising independently       Able to understand and use Dyspnea scale Yes       Intervention Provide education and explanation on how to use Dyspnea scale       Expected Outcomes Short Term: Able to use Dyspnea scale daily in rehab to express subjective sense of shortness of breath during exertion;Long Term: Able to use Dyspnea scale to guide intensity level when exercising independently       Knowledge and understanding of Target Heart Rate Range (THRR) Yes       Intervention Provide education and explanation of THRR including how the numbers were predicted and where they are located for reference       Expected Outcomes Short Term: Able to state/look up THRR;Short Term: Able to use daily as guideline for intensity in rehab;Long Term: Able to use THRR to govern intensity when exercising independently       Able to check pulse independently Yes       Intervention Provide education and demonstration on how to check pulse in carotid and radial arteries.;Review the importance of being able to check your own pulse for safety during independent exercise       Expected Outcomes Short Term: Able to explain why pulse checking is important during independent exercise;Long Term: Able to check pulse independently and accurately       Understanding of Exercise Prescription Yes        Intervention Provide education, explanation, and written materials on patient's individual exercise prescription       Expected Outcomes Short Term: Able to explain program exercise prescription;Long Term: Able to explain home exercise prescription to exercise independently          Exercise Goals Re-Evaluation :   Discharge Exercise Prescription (Final Exercise Prescription Changes):  Exercise Prescription Changes - 05/17/24 1500       Response to Exercise   Blood Pressure (Admit) 128/70    Blood Pressure (Exercise) 144/80    Blood Pressure (Exit) 138/62    Heart Rate (Admit) 68 bpm    Heart Rate (Exercise) 84 bpm    Heart Rate (Exit) 69 bpm    Oxygen Saturation (Admit) 94 %    Oxygen Saturation (Exercise) 92 %    Oxygen Saturation (Exit) 94 %    Rating of Perceived Exertion (Exercise) 10    Perceived Dyspnea (Exercise) 0    Symptoms none    Comments results      Progression   Average METs 0.14          Nutrition:  Target Goals: Understanding of nutrition guidelines, daily intake of sodium 1500mg , cholesterol 200mg , calories 30% from fat and 7% or less from saturated fats, daily to have 5 or more servings of fruits and vegetables.  Education: Nutrition 1 -Group instruction provided by verbal, written material, interactive activities, discussions, models, and posters to present general guidelines for heart healthy nutrition including macronutrients, label reading,  and promoting whole foods over processed counterparts. Education serves as Pensions consultant of discussion of heart healthy eating for all. Written material provided at class time.     Education: Nutrition 2 -Group instruction provided by verbal, written material, interactive activities, discussions, models, and posters to present general guidelines for heart healthy nutrition including sodium, cholesterol, and saturated fat. Providing guidance of habit forming to improve blood pressure, cholesterol, and body  weight. Written material provided at class time.     Biometrics:  Pre Biometrics - 05/17/24 1554       Pre Biometrics   Height 5' 6.61 (1.692 m)    Weight 227 lb 6.4 oz (103.1 kg)    Waist Circumference 50 inches    Hip Circumference 50 inches    Waist to Hip Ratio 1 %    BMI (Calculated) 36.03    Single Leg Stand 2.5 seconds           Nutrition Therapy Plan and Nutrition Goals:  Nutrition Therapy & Goals - 05/17/24 1555       Personal Nutrition Goals   Nutrition Goal Will meet w/ RD on 9/8      Intervention Plan   Intervention Prescribe, educate and counsel regarding individualized specific dietary modifications aiming towards targeted core components such as weight, hypertension, lipid management, diabetes, heart failure and other comorbidities.    Expected Outcomes Short Term Goal: Understand basic principles of dietary content, such as calories, fat, sodium, cholesterol and nutrients.          Nutrition Assessments:  MEDIFICTS Score Key: >=70 Need to make dietary changes  40-70 Heart Healthy Diet <= 40 Therapeutic Level Cholesterol Diet   Picture Your Plate Scores: <59 Unhealthy dietary pattern with much room for improvement. 41-50 Dietary pattern unlikely to meet recommendations for good health and room for improvement. 51-60 More healthful dietary pattern, with some room for improvement.  >60 Healthy dietary pattern, although there may be some specific behaviors that could be improved.   Nutrition Goals Re-Evaluation:   Nutrition Goals Discharge (Final Nutrition Goals Re-Evaluation):   Psychosocial: Target Goals: Acknowledge presence or absence of significant depression and/or stress, maximize coping skills, provide positive support system. Participant is able to verbalize types and ability to use techniques and skills needed for reducing stress and depression.   Education: Stress, Anxiety, and Depression - Group verbal and visual presentation to  define topics covered.  Reviews how body is impacted by stress, anxiety, and depression.  Also discusses healthy ways to reduce stress and to treat/manage anxiety and depression.  Written material provided at class time.   Education: Sleep Hygiene -Provides group verbal and written instruction about how sleep can affect your health.  Define sleep hygiene, discuss sleep cycles and impact of sleep habits. Review good sleep hygiene tips.    Initial Review & Psychosocial Screening:  Initial Psych Review & Screening - 05/10/24 1349       Initial Review   Current issues with Current Anxiety/Panic;Current Psychotropic Meds      Family Dynamics   Good Support System? Yes    Comments Jama states he has some anxiety from being couped up in his house. He can look to victoria his care giver/daugher and her husband.      Barriers   Psychosocial barriers to participate in program The patient should benefit from training in stress management and relaxation.      Screening Interventions   Interventions Encouraged to exercise;To provide support and resources with identified psychosocial needs;Provide  feedback about the scores to participant    Expected Outcomes Short Term goal: Utilizing psychosocial counselor, staff and physician to assist with identification of specific Stressors or current issues interfering with healing process. Setting desired goal for each stressor or current issue identified.;Long Term Goal: Stressors or current issues are controlled or eliminated.;Short Term goal: Identification and review with participant of any Quality of Life or Depression concerns found by scoring the questionnaire.;Long Term goal: The participant improves quality of Life and PHQ9 Scores as seen by post scores and/or verbalization of changes          Quality of Life Scores:  Scores of 19 and below usually indicate a poorer quality of life in these areas.  A difference of  2-3 points is a clinically  meaningful difference.  A difference of 2-3 points in the total score of the Quality of Life Index has been associated with significant improvement in overall quality of life, self-image, physical symptoms, and general health in studies assessing change in quality of life.  PHQ-9: Review Flowsheet       05/17/2024 10/12/2023  Depression screen PHQ 2/9  Decreased Interest 3 2  Down, Depressed, Hopeless 0 2  PHQ - 2 Score 3 4  Altered sleeping 1 2  Tired, decreased energy 2 3  Change in appetite 0 0  Feeling bad or failure about yourself  0 0  Trouble concentrating 0 0  Moving slowly or fidgety/restless 0 1  Suicidal thoughts 0 0  PHQ-9 Score 6 10  Difficult doing work/chores Not difficult at all Somewhat difficult   Interpretation of Total Score  Total Score Depression Severity:  1-4 = Minimal depression, 5-9 = Mild depression, 10-14 = Moderate depression, 15-19 = Moderately severe depression, 20-27 = Severe depression   Psychosocial Evaluation and Intervention:  Psychosocial Evaluation - 05/10/24 1353       Psychosocial Evaluation & Interventions   Interventions Encouraged to exercise with the program and follow exercise prescription;Relaxation education;Stress management education    Comments Jama states he has some anxiety from being couped up in his house. He can look to victoria his care giver and her husband.    Expected Outcomes Short: Start [facility] to help with mood. Long: Maintain a healthy mental state    Continue Psychosocial Services  Follow up required by staff          Psychosocial Re-Evaluation:   Psychosocial Discharge (Final Psychosocial Re-Evaluation):   Education: Education Goals: Education classes will be provided on a weekly basis, covering required topics. Participant will state understanding/return demonstration of topics presented.  Learning Barriers/Preferences:  Learning Barriers/Preferences - 05/10/24 1349       Learning  Barriers/Preferences   Learning Barriers Hearing;Sight    Learning Preferences None          General Pulmonary Education Topics:  Infection Prevention: - Provides verbal and written material to individual with discussion of infection control including proper hand washing and proper equipment cleaning during exercise session. Flowsheet Row Pulmonary Rehab from 05/17/2024 in [facility] Cardiac and Pulmonary Rehab  Date 05/17/24  Educator MB  Instruction Review Code 1- Verbalizes Understanding    Falls Prevention: - Provides verbal and written material to individual with discussion of falls prevention and safety. Flowsheet Row Pulmonary Rehab from 05/17/2024 in [facility] Cardiac and Pulmonary Rehab  Date 05/17/24  Educator MB  Instruction Review Code 1- Verbalizes Understanding    Chronic Lung Disease Review: - Group verbal instruction with posters, models, PowerPoint presentations and videos,  to review new updates, new respiratory medications, new advancements in procedures and treatments. Providing information on websites and 800 numbers for continued self-education. Includes information about supplement oxygen, available portable oxygen systems, continuous and intermittent flow rates, oxygen safety, concentrators, and Medicare reimbursement for oxygen. Explanation of Pulmonary Drugs, including class, frequency, complications, importance of spacers, rinsing mouth after steroid MDI's, and proper cleaning methods for nebulizers. Review of basic lung anatomy and physiology related to function, structure, and complications of lung disease. Review of risk factors. Discussion about methods for diagnosing sleep apnea and types of masks and machines for OSA. Includes a review of the use of types of environmental controls: home humidity, furnaces, filters, dust mite/pet prevention, HEPA vacuums. Discussion about weather changes, air quality and the benefits of nasal washing. Instruction on Warning signs,  infection symptoms, calling MD promptly, preventive modes, and value of vaccinations. Review of effective airway clearance, coughing and/or vibration techniques. Emphasizing that all should Create an Action Plan. Written material provided at class time.   AED/CPR: - Group verbal and written instruction with the use of models to demonstrate the basic use of the AED with the basic ABC's of resuscitation.    Tests and Procedures:  - Group verbal and visual presentation and models provide information about basic cardiac anatomy and function. Reviews the testing methods done to diagnose heart disease and the outcomes of the test results. Describes the treatment choices: Medical Management, Angioplasty, or Coronary Bypass Surgery for treating various heart conditions including Myocardial Infarction, Angina, Valve Disease, and Cardiac Arrhythmias.  Written material provided at class time.   Medication Safety: - Group verbal and visual instruction to review commonly prescribed medications for heart and lung disease. Reviews the medication, class of the drug, and side effects. Includes the steps to properly store meds and maintain the prescription regimen.  Written material given at graduation.   Other: -Provides group and verbal instruction on various topics (see comments)   Knowledge Questionnaire Score:    Core Components/Risk Factors/Patient Goals at Admission:  Personal Goals and Risk Factors at Admission - 05/17/24 1555       Core Components/Risk Factors/Patient Goals on Admission    Weight Management Yes;Weight Loss    Intervention Weight Management: Provide education and appropriate resources to help participant work on and attain dietary goals.;Weight Management/Obesity: Establish reasonable short term and long term weight goals.;Weight Management: Develop a combined nutrition and exercise program designed to reach desired caloric intake, while maintaining appropriate intake of nutrient  and fiber, sodium and fats, and appropriate energy expenditure required for the weight goal.    Admit Weight 227 lb 6.4 oz (103.1 kg)    Goal Weight: Short Term 208 lb (94.3 kg)    Goal Weight: Long Term 190 lb (86.2 kg)    Expected Outcomes Short Term: Continue to assess and modify interventions until short term weight is achieved;Long Term: Adherence to nutrition and physical activity/exercise program aimed toward attainment of established weight goal;Weight Loss: Understanding of general recommendations for a balanced deficit meal plan, which promotes 1-2 lb weight loss per week and includes a negative energy balance of [phone] kcal/d;Understanding of distribution of calorie intake throughout the day with the consumption of 4-5 meals/snacks;Understanding recommendations for meals to include 15-35% energy as protein, 25-35% energy from fat, 35-60% energy from carbohydrates, less than 200mg  of dietary cholesterol, 20-35 gm of total fiber daily    Improve shortness of breath with ADL's Yes    Intervention Provide education, individualized exercise plan and  daily activity instruction to help decrease symptoms of SOB with activities of daily living.    Expected Outcomes Short Term: Improve cardiorespiratory fitness to achieve a reduction of symptoms when performing ADLs;Long Term: Be able to perform more ADLs without symptoms or delay the onset of symptoms    Hypertension Yes    Intervention Provide education on lifestyle modifcations including regular physical activity/exercise, weight management, moderate sodium restriction and increased consumption of fresh fruit, vegetables, and low fat dairy, alcohol moderation, and smoking cessation.;Monitor prescription use compliance.    Expected Outcomes Short Term: Continued assessment and intervention until BP is < 140/83mm HG in hypertensive participants. < 130/33mm HG in hypertensive participants with diabetes, heart failure or chronic kidney disease.;Long  Term: Maintenance of blood pressure at goal levels.    Lipids Yes    Intervention Provide education and support for participant on nutrition & aerobic/resistive exercise along with prescribed medications to achieve LDL 70mg , HDL >40mg .    Expected Outcomes Short Term: Participant states understanding of desired cholesterol values and is compliant with medications prescribed. Participant is following exercise prescription and nutrition guidelines.;Long Term: Cholesterol controlled with medications as prescribed, with individualized exercise RX and with personalized nutrition plan. Value goals: LDL < 70mg , HDL > 40 mg.          Education:Diabetes - Individual verbal and written instruction to review signs/symptoms of diabetes, desired ranges of glucose level fasting, after meals and with exercise. Acknowledge that pre and post exercise glucose checks will be done for 3 sessions at entry of program.   Know Your Numbers and Heart Failure: - Group verbal and visual instruction to discuss disease risk factors for cardiac and pulmonary disease and treatment options.  Reviews associated critical values for Overweight/Obesity, Hypertension, Cholesterol, and Diabetes.  Discusses basics of heart failure: signs/symptoms and treatments.  Introduces Heart Failure Zone chart for action plan for heart failure. Written material provided at class time.   Core Components/Risk Factors/Patient Goals Review:    Core Components/Risk Factors/Patient Goals at Discharge (Final Review):    ITP Comments:  ITP Comments     Row Name 05/10/24 1400 05/17/24 1549         ITP Comments Virtual Visit completed. Patient informed on EP and RD appointment and 6 Minute walk test. Patient also informed of patient health questionnaires on My Chart. Patient Verbalizes understanding. Visit diagnosis can be found in [facility] 01/11/2024. Completed and gym orientation for respiratory care services. Initial ITP created and sent for  review to Dr. Faud Aleskerov, Medical Director.         Comments: Initial ITP

## 2024-05-17 NOTE — Progress Notes (Deleted)
 Cardiac Individual Treatment Plan  Patient Details  Name: Jeffery Ward MRN: [id number] Date of Birth: [birth year] Referring Provider:   Flowsheet Row Pulmonary Rehab from 05/17/2024 in [facility] Cardiac and Pulmonary Rehab  Referring Provider Onita Pulling, MD    Initial Encounter Date:  Flowsheet Row Pulmonary Rehab from 05/17/2024 in [facility] Cardiac and Pulmonary Rehab  Date 05/17/24    Visit Diagnosis: Shortness of breath  Patient's Home Medications on Admission:  Current Outpatient Medications:    acetaminophen  (TYLENOL ) 650 MG CR tablet, Take 650 mg by mouth every 8 (eight) hours as needed for pain., Disp: , Rfl:    aspirin  81 MG EC tablet, Take 81 mg by mouth daily. Swallow whole. (Patient not taking: Reported on 05/04/2024), Disp: , Rfl:    busPIRone  (BUSPAR ) 5 MG tablet, May take TID PRN anxiety, Disp: 90 tablet, Rfl: 3   Cholecalciferol (D3-1000 PO), Take by mouth., Disp: , Rfl:    clopidogrel  (PLAVIX ) 75 MG tablet, Take 1 tablet (75 mg total) by mouth daily., Disp: 90 tablet, Rfl: 1   Coenzyme Q10 (COQ10) 200 MG CAPS, Take 200 mg by mouth daily., Disp: , Rfl:    diphenhydramine-acetaminophen  (TYLENOL  PM) 25-500 MG TABS tablet, Take 1 tablet by mouth at bedtime as needed., Disp: , Rfl:    ferrous sulfate  324 (65 Fe) MG TBEC, Take by mouth., Disp: , Rfl:    fluticasone  (FLONASE ) 50 MCG/ACT nasal spray, Place 2 sprays into both nostrils daily., Disp: 11.1 mL, Rfl: 3   ketorolac  (ACULAR ) 0.5 % ophthalmic solution, Place 1 drop into the right eye 4 (four) times daily., Disp: , Rfl:    metoprolol  succinate (TOPROL -XL) 25 MG 24 hr tablet, Take 1 tablet (25 mg total) by mouth daily., Disp: 90 tablet, Rfl: 0   moxifloxacin  (VIGAMOX ) 0.5 % ophthalmic solution, Place 1 drop into the right eye 4 (four) times daily., Disp: , Rfl:    nystatin  cream (MYCOSTATIN ), Apply 1 Application topically 2 (two) times daily., Disp: 30 g, Rfl: 1   pantoprazole  (PROTONIX ) 40 MG tablet, Take 1 tablet (40 mg  total) by mouth daily., Disp: 90 tablet, Rfl: 0   potassium citrate  (UROCIT-K ) 10 MEQ (1080 MG) SR tablet, Take 1 tablet (10 mEq total) by mouth daily., Disp: 30 tablet, Rfl: 3   prednisoLONE acetate (PRED FORTE) 1 % ophthalmic suspension, Place 1 drop into the right eye 4 (four) times daily., Disp: , Rfl:    rosuvastatin  (CRESTOR ) 20 MG tablet, Take 1 tablet (20 mg total) by mouth at bedtime., Disp: 90 tablet, Rfl: 3   vitamin C (ASCORBIC ACID ) 250 MG tablet, Take 500 mg by mouth daily., Disp: , Rfl:    zinc gluconate 50 MG tablet, Take 50 mg by mouth daily., Disp: , Rfl:   Past Medical History: Past Medical History:  Diagnosis Date   Anxiety    Arthritis    back   Balanitis    BPH without obstruction/lower urinary tract symptoms    Coronary artery arteriosclerosis    Erectile dysfunction    GERD (gastroesophageal reflux disease)    Hearing difficulty    Hematuria    Hx of CABG    Hypogonadism in male    Kidney stones    Microscopic hematuria    Obesity    Primary hypertension    Urinary urgency     Tobacco Use: Social History   Tobacco Use  Smoking Status Never   Passive exposure: Never  Smokeless Tobacco Not on file  Labs: Review Flowsheet  More data may exist      Latest Ref Rng & Units 10/03/2015 10/12/2023 01/11/2024 04/21/2024 04/28/2024  Labs for ITP Cardiac and Pulmonary Rehab  Cholestrol 100 - 199 mg/dL 865  861  851  - 835   LDL (calc) 0 - 99 mg/dL 66  60  66  - 86   HDL-C >39 mg/dL 54  52  57  - 64   Trlycerides 0 - 149 mg/dL 69  843  853  - 74   Hemoglobin A1c 4.0 - 5.6 % - 6.9  6.8  6.8  -     Exercise Target Goals: Exercise Program Goal: Individual exercise prescription set using results from initial 6 min walk test and THRR while considering  patient's activity barriers and safety.   Exercise Prescription Goal: Initial exercise prescription builds to 30-45 minutes a day of aerobic activity, 2-3 days per week.  Home exercise guidelines will be given  to patient during program as part of exercise prescription that the participant will acknowledge.   Education: Aerobic Exercise: - Group verbal and visual presentation on the components of exercise prescription. Introduces F.I.T.T principle from ACSM for exercise prescriptions.  Reviews F.I.T.T. principles of aerobic exercise including progression. Written material provided at class time.   Education: Resistance Exercise: - Group verbal and visual presentation on the components of exercise prescription. Introduces F.I.T.T principle from ACSM for exercise prescriptions  Reviews F.I.T.T. principles of resistance exercise including progression. Written material provided at class time.    Education: Exercise & Equipment Safety: - Individual verbal instruction and demonstration of equipment use and safety with use of the equipment. Flowsheet Row Pulmonary Rehab from 05/17/2024 in [facility] Cardiac and Pulmonary Rehab  Date 05/17/24  Educator MB  Instruction Review Code 1- Verbalizes Understanding    Education: Exercise Physiology & General Exercise Guidelines: - Group verbal and written instruction with models to review the exercise physiology of the cardiovascular system and associated critical values. Provides general exercise guidelines with specific guidelines to those with heart or lung disease. Written material provided at class time.   Education: Flexibility, Balance, Mind/Body Relaxation: - Group verbal and visual presentation with interactive activity on the components of exercise prescription. Introduces F.I.T.T principle from ACSM for exercise prescriptions. Reviews F.I.T.T. principles of flexibility and balance exercise training including progression. Also discusses the mind body connection.  Reviews various relaxation techniques to help reduce and manage stress (i.e. Deep breathing, progressive muscle relaxation, and visualization). Balance handout provided to take home. Written material  provided at class time.   Activity Barriers & Risk Stratification:  Activity Barriers & Cardiac Risk Stratification - 05/17/24 1551       Activity Barriers & Cardiac Risk Stratification   Activity Barriers Arthritis;Assistive Device          6 Minute Walk:  6 Minute Walk     Row Name 05/17/24 1549         6 Minute Walk   Phase Initial     Distance 610 feet     Walk Time 6 minutes     # of Rest Breaks 0     MPH 1.16     METS 0.14     RPE 10     Perceived Dyspnea  0     VO2 Peak 0.5     Symptoms No     Resting HR 68 bpm     Resting BP 128/70     Resting Oxygen Saturation  94 %  Exercise Oxygen Saturation  during 6 min walk 92 %     Max Ex. HR 84 bpm     Max Ex. BP 144/80     2 Minute Post BP 138/62       Interval HR   1 Minute HR 73     2 Minute HR 66     3 Minute HR 80     4 Minute HR 75     5 Minute HR 84     6 Minute HR 75     2 Minute Post HR 69     Interval Heart Rate? Yes       Interval Oxygen   Interval Oxygen? Yes     Baseline Oxygen Saturation % 94 %     1 Minute Oxygen Saturation % 94 %     1 Minute Liters of Oxygen 0 L     2 Minute Oxygen Saturation % 93 %     2 Minute Liters of Oxygen 0 L     3 Minute Oxygen Saturation % 92 %     3 Minute Liters of Oxygen 0 L     4 Minute Oxygen Saturation % 92 %     4 Minute Liters of Oxygen 0 L     5 Minute Oxygen Saturation % 93 %     5 Minute Liters of Oxygen 0 L     6 Minute Oxygen Saturation % 93 %     6 Minute Liters of Oxygen 0 L     2 Minute Post Oxygen Saturation % 94 %     2 Minute Post Liters of Oxygen 0 L        Oxygen Initial Assessment:   Oxygen Re-Evaluation:   Oxygen Discharge (Final Oxygen Re-Evaluation):   Initial Exercise Prescription:  Initial Exercise Prescription - 05/17/24 1500       Date of Initial Exercise RX and Referring Provider   Date 05/17/24    Referring Provider Ziglar, Susan, MD      Oxygen   Maintain Oxygen Saturation 88% or higher      Recumbant  Bike   Level 1    RPM 50    Watts 15    Minutes 15    METs 0.14      NuStep   Level 1    SPM 80    Minutes 15    METs 0.14      Biostep-RELP   Level 1    SPM 50    Minutes 15    METs 0.14      Track   Laps 12    Minutes 15    METs 1.65      Prescription Details   Frequency (times per week) 2    Duration Progress to 30 minutes of continuous aerobic without signs/symptoms of physical distress      Intensity   THRR 40-80% of Max Heartrate 93-119    Ratings of Perceived Exertion 11-13    Perceived Dyspnea 0-4      Progression   Progression Continue to progress workloads to maintain intensity without signs/symptoms of physical distress.      Resistance Training   Training Prescription Yes    Weight 3 lb    Reps 10-15          Perform Capillary Blood Glucose checks as needed.  Exercise Prescription Changes:   Exercise Prescription Changes     Row Name 05/17/24 1500  Response to Exercise   Blood Pressure (Admit) 128/70       Blood Pressure (Exercise) 144/80       Blood Pressure (Exit) 138/62       Heart Rate (Admit) 68 bpm       Heart Rate (Exercise) 84 bpm       Heart Rate (Exit) 69 bpm       Oxygen Saturation (Admit) 94 %       Oxygen Saturation (Exercise) 92 %       Oxygen Saturation (Exit) 94 %       Rating of Perceived Exertion (Exercise) 10       Perceived Dyspnea (Exercise) 0       Symptoms none       Comments results         Progression   Average METs 0.14          Exercise Comments:   Exercise Goals and Review:   Exercise Goals     Row Name 05/17/24 1554             Exercise Goals   Increase Physical Activity Yes       Intervention Provide advice, education, support and counseling about physical activity/exercise needs.;Develop an individualized exercise prescription for aerobic and resistive training based on initial evaluation findings, risk stratification, comorbidities and participant's personal goals.        Expected Outcomes Short Term: Attend rehab on a regular basis to increase amount of physical activity.;Long Term: Add in home exercise to make exercise part of routine and to increase amount of physical activity.;Long Term: Exercising regularly at least 3-5 days a week.       Increase Strength and Stamina Yes       Intervention Provide advice, education, support and counseling about physical activity/exercise needs.;Develop an individualized exercise prescription for aerobic and resistive training based on initial evaluation findings, risk stratification, comorbidities and participant's personal goals.       Expected Outcomes Short Term: Increase workloads from initial exercise prescription for resistance, speed, and METs.;Short Term: Perform resistance training exercises routinely during rehab and add in resistance training at home;Long Term: Improve cardiorespiratory fitness, muscular endurance and strength as measured by increased METs and functional capacity ( )       Able to understand and use rate of perceived exertion (RPE) scale Yes       Intervention Provide education and explanation on how to use RPE scale       Expected Outcomes Short Term: Able to use RPE daily in rehab to express subjective intensity level;Long Term:  Able to use RPE to guide intensity level when exercising independently       Able to understand and use Dyspnea scale Yes       Intervention Provide education and explanation on how to use Dyspnea scale       Expected Outcomes Short Term: Able to use Dyspnea scale daily in rehab to express subjective sense of shortness of breath during exertion;Long Term: Able to use Dyspnea scale to guide intensity level when exercising independently       Knowledge and understanding of Target Heart Rate Range (THRR) Yes       Intervention Provide education and explanation of THRR including how the numbers were predicted and where they are located for reference       Expected Outcomes  Short Term: Able to state/look up THRR;Short Term: Able to use daily as guideline for intensity in rehab;Long Term: Able  to use THRR to govern intensity when exercising independently       Able to check pulse independently Yes       Intervention Provide education and demonstration on how to check pulse in carotid and radial arteries.;Review the importance of being able to check your own pulse for safety during independent exercise       Expected Outcomes Short Term: Able to explain why pulse checking is important during independent exercise;Long Term: Able to check pulse independently and accurately       Understanding of Exercise Prescription Yes       Intervention Provide education, explanation, and written materials on patient's individual exercise prescription       Expected Outcomes Short Term: Able to explain program exercise prescription;Long Term: Able to explain home exercise prescription to exercise independently          Exercise Goals Re-Evaluation :   Discharge Exercise Prescription (Final Exercise Prescription Changes):  Exercise Prescription Changes - 05/17/24 1500       Response to Exercise   Blood Pressure (Admit) 128/70    Blood Pressure (Exercise) 144/80    Blood Pressure (Exit) 138/62    Heart Rate (Admit) 68 bpm    Heart Rate (Exercise) 84 bpm    Heart Rate (Exit) 69 bpm    Oxygen Saturation (Admit) 94 %    Oxygen Saturation (Exercise) 92 %    Oxygen Saturation (Exit) 94 %    Rating of Perceived Exertion (Exercise) 10    Perceived Dyspnea (Exercise) 0    Symptoms none    Comments results      Progression   Average METs 0.14          Nutrition:  Target Goals: Understanding of nutrition guidelines, daily intake of sodium 1500mg , cholesterol 200mg , calories 30% from fat and 7% or less from saturated fats, daily to have 5 or more servings of fruits and vegetables.  Education: Nutrition 1 -Group instruction provided by verbal, written material,  interactive activities, discussions, models, and posters to present general guidelines for heart healthy nutrition including macronutrients, label reading, and promoting whole foods over processed counterparts. Education serves as Pensions consultant of discussion of heart healthy eating for all. Written material provided at class time.    Education: Nutrition 2 -Group instruction provided by verbal, written material, interactive activities, discussions, models, and posters to present general guidelines for heart healthy nutrition including sodium, cholesterol, and saturated fat. Providing guidance of habit forming to improve blood pressure, cholesterol, and body weight. Written material provided at class time.     Biometrics:  Pre Biometrics - 05/17/24 1554       Pre Biometrics   Height 5' 6.61 (1.692 m)    Weight 227 lb 6.4 oz (103.1 kg)    Waist Circumference 50 inches    Hip Circumference 50 inches    Waist to Hip Ratio 1 %    BMI (Calculated) 36.03    Single Leg Stand 2.5 seconds           Nutrition Therapy Plan and Nutrition Goals:  Nutrition Therapy & Goals - 05/17/24 1555       Personal Nutrition Goals   Nutrition Goal Will meet w/ RD on 9/8      Intervention Plan   Intervention Prescribe, educate and counsel regarding individualized specific dietary modifications aiming towards targeted core components such as weight, hypertension, lipid management, diabetes, heart failure and other comorbidities.    Expected Outcomes Short Term Goal: Understand basic  principles of dietary content, such as calories, fat, sodium, cholesterol and nutrients.          Nutrition Assessments:  MEDIFICTS Score Key: >=70 Need to make dietary changes  40-70 Heart Healthy Diet <= 40 Therapeutic Level Cholesterol Diet   Picture Your Plate Scores: <59 Unhealthy dietary pattern with much room for improvement. 41-50 Dietary pattern unlikely to meet recommendations for good health and room for  improvement. 51-60 More healthful dietary pattern, with some room for improvement.  >60 Healthy dietary pattern, although there may be some specific behaviors that could be improved.    Nutrition Goals Re-Evaluation:   Nutrition Goals Discharge (Final Nutrition Goals Re-Evaluation):   Psychosocial: Target Goals: Acknowledge presence or absence of significant depression and/or stress, maximize coping skills, provide positive support system. Participant is able to verbalize types and ability to use techniques and skills needed for reducing stress and depression.   Education: Stress, Anxiety, and Depression - Group verbal and visual presentation to define topics covered.  Reviews how body is impacted by stress, anxiety, and depression.  Also discusses healthy ways to reduce stress and to treat/manage anxiety and depression. Written material provided at class time.   Education: Sleep Hygiene -Provides group verbal and written instruction about how sleep can affect your health.  Define sleep hygiene, discuss sleep cycles and impact of sleep habits. Review good sleep hygiene tips.   Initial Review & Psychosocial Screening:  Initial Psych Review & Screening - 05/10/24 1349       Initial Review   Current issues with Current Anxiety/Panic;Current Psychotropic Meds      Family Dynamics   Good Support System? Yes    Comments Jama states he has some anxiety from being couped up in his house. He can look to victoria his care giver/daugher and her husband.      Barriers   Psychosocial barriers to participate in program The patient should benefit from training in stress management and relaxation.      Screening Interventions   Interventions Encouraged to exercise;To provide support and resources with identified psychosocial needs;Provide feedback about the scores to participant    Expected Outcomes Short Term goal: Utilizing psychosocial counselor, staff and physician to assist with  identification of specific Stressors or current issues interfering with healing process. Setting desired goal for each stressor or current issue identified.;Long Term Goal: Stressors or current issues are controlled or eliminated.;Short Term goal: Identification and review with participant of any Quality of Life or Depression concerns found by scoring the questionnaire.;Long Term goal: The participant improves quality of Life and PHQ9 Scores as seen by post scores and/or verbalization of changes          Quality of Life Scores:   Scores of 19 and below usually indicate a poorer quality of life in these areas.  A difference of  2-3 points is a clinically meaningful difference.  A difference of 2-3 points in the total score of the Quality of Life Index has been associated with significant improvement in overall quality of life, self-image, physical symptoms, and general health in studies assessing change in quality of life.  PHQ-9: Review Flowsheet       05/17/2024 10/12/2023  Depression screen PHQ 2/9  Decreased Interest 3 2  Down, Depressed, Hopeless 0 2  PHQ - 2 Score 3 4  Altered sleeping 1 2  Tired, decreased energy 2 3  Change in appetite 0 0  Feeling bad or failure about yourself  0 0  Trouble  concentrating 0 0  Moving slowly or fidgety/restless 0 1  Suicidal thoughts 0 0  PHQ-9 Score 6 10  Difficult doing work/chores Not difficult at all Somewhat difficult   Interpretation of Total Score  Total Score Depression Severity:  1-4 = Minimal depression, 5-9 = Mild depression, 10-14 = Moderate depression, 15-19 = Moderately severe depression, 20-27 = Severe depression   Psychosocial Evaluation and Intervention:  Psychosocial Evaluation - 05/10/24 1353       Psychosocial Evaluation & Interventions   Interventions Encouraged to exercise with the program and follow exercise prescription;Relaxation education;Stress management education    Comments Jama states he has some anxiety from  being couped up in his house. He can look to victoria his care giver and her husband.    Expected Outcomes Short: Start [facility] to help with mood. Long: Maintain a healthy mental state    Continue Psychosocial Services  Follow up required by staff          Psychosocial Re-Evaluation:   Psychosocial Discharge (Final Psychosocial Re-Evaluation):   Vocational Rehabilitation: Provide vocational rehab assistance to qualifying candidates.   Vocational Rehab Evaluation & Intervention:   Education: Education Goals: Education classes will be provided on a variety of topics geared toward better understanding of heart health and risk factor modification. Participant will state understanding/return demonstration of topics presented as noted by education test scores.  Learning Barriers/Preferences:  Learning Barriers/Preferences - 05/10/24 1349       Learning Barriers/Preferences   Learning Barriers Hearing;Sight    Learning Preferences None          General Cardiac Education Topics:  AED/CPR: - Group verbal and written instruction with the use of models to demonstrate the basic use of the AED with the basic ABC's of resuscitation.   Test and Procedures: - Group verbal and visual presentation and models provide information about basic cardiac anatomy and function. Reviews the testing methods done to diagnose heart disease and the outcomes of the test results. Describes the treatment choices: Medical Management, Angioplasty, or Coronary Bypass Surgery for treating various heart conditions including Myocardial Infarction, Angina, Valve Disease, and Cardiac Arrhythmias. Written material provided at class time.   Medication Safety: - Group verbal and visual instruction to review commonly prescribed medications for heart and lung disease. Reviews the medication, class of the drug, and side effects. Includes the steps to properly store meds and maintain the prescription regimen. Written  material provided at class time.   Intimacy: - Group verbal instruction through game format to discuss how heart and lung disease can affect sexual intimacy. Written material provided at class time.   Know Your Numbers and Heart Failure: - Group verbal and visual instruction to discuss disease risk factors for cardiac and pulmonary disease and treatment options.  Reviews associated critical values for Overweight/Obesity, Hypertension, Cholesterol, and Diabetes.  Discusses basics of heart failure: signs/symptoms and treatments.  Introduces Heart Failure Zone chart for action plan for heart failure. Written material provided at class time.   Infection Prevention: - Provides verbal and written material to individual with discussion of infection control including proper hand washing and proper equipment cleaning during exercise session. Flowsheet Row Pulmonary Rehab from 05/17/2024 in [facility] Cardiac and Pulmonary Rehab  Date 05/17/24  Educator MB  Instruction Review Code 1- Verbalizes Understanding    Falls Prevention: - Provides verbal and written material to individual with discussion of falls prevention and safety. Flowsheet Row Pulmonary Rehab from 05/17/2024 in [facility] Cardiac and Pulmonary Rehab  Date  05/17/24  Educator MB  Instruction Review Code 1- Verbalizes Understanding    Other: -Provides group and verbal instruction on various topics (see comments)   Knowledge Questionnaire Score:   Core Components/Risk Factors/Patient Goals at Admission:  Personal Goals and Risk Factors at Admission - 05/17/24 1555       Core Components/Risk Factors/Patient Goals on Admission    Weight Management Yes;Weight Loss    Intervention Weight Management: Provide education and appropriate resources to help participant work on and attain dietary goals.;Weight Management/Obesity: Establish reasonable short term and long term weight goals.;Weight Management: Develop a combined nutrition and exercise  program designed to reach desired caloric intake, while maintaining appropriate intake of nutrient and fiber, sodium and fats, and appropriate energy expenditure required for the weight goal.    Admit Weight 227 lb 6.4 oz (103.1 kg)    Goal Weight: Short Term 208 lb (94.3 kg)    Goal Weight: Long Term 190 lb (86.2 kg)    Expected Outcomes Short Term: Continue to assess and modify interventions until short term weight is achieved;Long Term: Adherence to nutrition and physical activity/exercise program aimed toward attainment of established weight goal;Weight Loss: Understanding of general recommendations for a balanced deficit meal plan, which promotes 1-2 lb weight loss per week and includes a negative energy balance of [phone] kcal/d;Understanding of distribution of calorie intake throughout the day with the consumption of 4-5 meals/snacks;Understanding recommendations for meals to include 15-35% energy as protein, 25-35% energy from fat, 35-60% energy from carbohydrates, less than 200mg  of dietary cholesterol, 20-35 gm of total fiber daily    Improve shortness of breath with ADL's Yes    Intervention Provide education, individualized exercise plan and daily activity instruction to help decrease symptoms of SOB with activities of daily living.    Expected Outcomes Short Term: Improve cardiorespiratory fitness to achieve a reduction of symptoms when performing ADLs;Long Term: Be able to perform more ADLs without symptoms or delay the onset of symptoms    Hypertension Yes    Intervention Provide education on lifestyle modifcations including regular physical activity/exercise, weight management, moderate sodium restriction and increased consumption of fresh fruit, vegetables, and low fat dairy, alcohol moderation, and smoking cessation.;Monitor prescription use compliance.    Expected Outcomes Short Term: Continued assessment and intervention until BP is < 140/65mm HG in hypertensive participants. <  130/62mm HG in hypertensive participants with diabetes, heart failure or chronic kidney disease.;Long Term: Maintenance of blood pressure at goal levels.    Lipids Yes    Intervention Provide education and support for participant on nutrition & aerobic/resistive exercise along with prescribed medications to achieve LDL 70mg , HDL >40mg .    Expected Outcomes Short Term: Participant states understanding of desired cholesterol values and is compliant with medications prescribed. Participant is following exercise prescription and nutrition guidelines.;Long Term: Cholesterol controlled with medications as prescribed, with individualized exercise RX and with personalized nutrition plan. Value goals: LDL < 70mg , HDL > 40 mg.          Education:Diabetes - Individual verbal and written instruction to review signs/symptoms of diabetes, desired ranges of glucose level fasting, after meals and with exercise. Acknowledge that pre and post exercise glucose checks will be done for 3 sessions at entry of program.   Core Components/Risk Factors/Patient Goals Review:    Core Components/Risk Factors/Patient Goals at Discharge (Final Review):    ITP Comments:  ITP Comments     Row Name 05/10/24 1400 05/17/24 1549  ITP Comments Virtual Visit completed. Patient informed on EP and RD appointment and 6 Minute walk test. Patient also informed of patient health questionnaires on My Chart. Patient Verbalizes understanding. Visit diagnosis can be found in [facility] 01/11/2024. Completed and gym orientation for cardiac rehab. Initial ITP created and sent for review to Dr. Oneil Pinal, Medical Director.         Comments: ***

## 2024-05-17 NOTE — Patient Instructions (Signed)
 Patient Instructions  Patient Details  Name: Jeffery Ward MRN: [id number] Date of Birth: April 08, 1933 Referring Provider:  Ziglar, Susan K, MD  Below are your personal goals for exercise, nutrition, and risk factors. Our goal is to help you stay on track towards obtaining and maintaining these goals. We will be discussing your progress on these goals with you throughout the program.  Initial Exercise Prescription:  Initial Exercise Prescription - 05/17/24 1500       Date of Initial Exercise RX and Referring Provider   Date 05/17/24    Referring Provider Ziglar, Susan, MD      Oxygen   Maintain Oxygen Saturation 88% or higher      Recumbant Bike   Level 1    RPM 50    Watts 15    Minutes 15    METs 0.14      NuStep   Level 1    SPM 80    Minutes 15    METs 0.14      Biostep-RELP   Level 1    SPM 50    Minutes 15    METs 0.14      Track   Laps 12    Minutes 15    METs 1.65      Prescription Details   Frequency (times per week) 2    Duration Progress to 30 minutes of continuous aerobic without signs/symptoms of physical distress      Intensity   THRR 40-80% of Max Heartrate 93-119    Ratings of Perceived Exertion 11-13    Perceived Dyspnea 0-4      Progression   Progression Continue to progress workloads to maintain intensity without signs/symptoms of physical distress.      Resistance Training   Training Prescription Yes    Weight 3 lb    Reps 10-15          Exercise Goals: Frequency: Be able to perform aerobic exercise two to three times per week in program working toward 2-5 days per week of home exercise.  Intensity: Work with a perceived exertion of 11 (fairly light) - 15 (hard) while following your exercise prescription.  We will make changes to your prescription with you as you progress through the program.   Duration: Be able to do 30 to 45 minutes of continuous aerobic exercise in addition to a 5 minute warm-up and a 5 minute cool-down  routine.   Nutrition Goals: Your personal nutrition goals will be established when you do your nutrition analysis with the dietician.  The following are general nutrition guidelines to follow: Cholesterol < 200mg /day Sodium < 1500mg /day Fiber: Men over 50 yrs - 30 grams per day  Personal Goals:  Personal Goals and Risk Factors at Admission - 05/17/24 1555       Core Components/Risk Factors/Patient Goals on Admission    Weight Management Yes;Weight Loss    Intervention Weight Management: Provide education and appropriate resources to help participant work on and attain dietary goals.;Weight Management/Obesity: Establish reasonable short term and long term weight goals.;Weight Management: Develop a combined nutrition and exercise program designed to reach desired caloric intake, while maintaining appropriate intake of nutrient and fiber, sodium and fats, and appropriate energy expenditure required for the weight goal.    Admit Weight 227 lb 6.4 oz (103.1 kg)    Goal Weight: Short Term 208 lb (94.3 kg)    Goal Weight: Long Term 190 lb (86.2 kg)    Expected Outcomes Short Term: Continue  to assess and modify interventions until short term weight is achieved;Long Term: Adherence to nutrition and physical activity/exercise program aimed toward attainment of established weight goal;Weight Loss: Understanding of general recommendations for a balanced deficit meal plan, which promotes 1-2 lb weight loss per week and includes a negative energy balance of [phone] kcal/d;Understanding of distribution of calorie intake throughout the day with the consumption of 4-5 meals/snacks;Understanding recommendations for meals to include 15-35% energy as protein, 25-35% energy from fat, 35-60% energy from carbohydrates, less than 200mg  of dietary cholesterol, 20-35 gm of total fiber daily    Improve shortness of breath with ADL's Yes    Intervention Provide education, individualized exercise plan and daily activity  instruction to help decrease symptoms of SOB with activities of daily living.    Expected Outcomes Short Term: Improve cardiorespiratory fitness to achieve a reduction of symptoms when performing ADLs;Long Term: Be able to perform more ADLs without symptoms or delay the onset of symptoms    Hypertension Yes    Intervention Provide education on lifestyle modifcations including regular physical activity/exercise, weight management, moderate sodium restriction and increased consumption of fresh fruit, vegetables, and low fat dairy, alcohol moderation, and smoking cessation.;Monitor prescription use compliance.    Expected Outcomes Short Term: Continued assessment and intervention until BP is < 140/73mm HG in hypertensive participants. < 130/64mm HG in hypertensive participants with diabetes, heart failure or chronic kidney disease.;Long Term: Maintenance of blood pressure at goal levels.    Lipids Yes    Intervention Provide education and support for participant on nutrition & aerobic/resistive exercise along with prescribed medications to achieve LDL 70mg , HDL >40mg .    Expected Outcomes Short Term: Participant states understanding of desired cholesterol values and is compliant with medications prescribed. Participant is following exercise prescription and nutrition guidelines.;Long Term: Cholesterol controlled with medications as prescribed, with individualized exercise RX and with personalized nutrition plan. Value goals: LDL < 70mg , HDL > 40 mg.          Tobacco Use Initial Evaluation: Social History   Tobacco Use  Smoking Status Never   Passive exposure: Never  Smokeless Tobacco Not on file    Exercise Goals and Review:  Exercise Goals     Row Name 05/17/24 1554             Exercise Goals   Increase Physical Activity Yes       Intervention Provide advice, education, support and counseling about physical activity/exercise needs.;Develop an individualized exercise prescription for  aerobic and resistive training based on initial evaluation findings, risk stratification, comorbidities and participant's personal goals.       Expected Outcomes Short Term: Attend rehab on a regular basis to increase amount of physical activity.;Long Term: Add in home exercise to make exercise part of routine and to increase amount of physical activity.;Long Term: Exercising regularly at least 3-5 days a week.       Increase Strength and Stamina Yes       Intervention Provide advice, education, support and counseling about physical activity/exercise needs.;Develop an individualized exercise prescription for aerobic and resistive training based on initial evaluation findings, risk stratification, comorbidities and participant's personal goals.       Expected Outcomes Short Term: Increase workloads from initial exercise prescription for resistance, speed, and METs.;Short Term: Perform resistance training exercises routinely during rehab and add in resistance training at home;Long Term: Improve cardiorespiratory fitness, muscular endurance and strength as measured by increased METs and functional capacity ( )  Able to understand and use rate of perceived exertion (RPE) scale Yes       Intervention Provide education and explanation on how to use RPE scale       Expected Outcomes Short Term: Able to use RPE daily in rehab to express subjective intensity level;Long Term:  Able to use RPE to guide intensity level when exercising independently       Able to understand and use Dyspnea scale Yes       Intervention Provide education and explanation on how to use Dyspnea scale       Expected Outcomes Short Term: Able to use Dyspnea scale daily in rehab to express subjective sense of shortness of breath during exertion;Long Term: Able to use Dyspnea scale to guide intensity level when exercising independently       Knowledge and understanding of Target Heart Rate Range (THRR) Yes       Intervention Provide  education and explanation of THRR including how the numbers were predicted and where they are located for reference       Expected Outcomes Short Term: Able to state/look up THRR;Short Term: Able to use daily as guideline for intensity in rehab;Long Term: Able to use THRR to govern intensity when exercising independently       Able to check pulse independently Yes       Intervention Provide education and demonstration on how to check pulse in carotid and radial arteries.;Review the importance of being able to check your own pulse for safety during independent exercise       Expected Outcomes Short Term: Able to explain why pulse checking is important during independent exercise;Long Term: Able to check pulse independently and accurately       Understanding of Exercise Prescription Yes       Intervention Provide education, explanation, and written materials on patient's individual exercise prescription       Expected Outcomes Short Term: Able to explain program exercise prescription;Long Term: Able to explain home exercise prescription to exercise independently

## 2024-05-20 ENCOUNTER — Emergency Department
Admission: EM | Admit: 2024-05-20 | Discharge: 2024-05-20 | Disposition: A | Discharge status: Home / Self Care | Attending: Emergency Medicine | Admitting: Emergency Medicine

## 2024-05-20 ENCOUNTER — Other Ambulatory Visit: Payer: Self-pay

## 2024-05-20 ENCOUNTER — Emergency Department

## 2024-05-20 DIAGNOSIS — R0600 Dyspnea, unspecified: Secondary | ICD-10-CM | POA: Diagnosis not present

## 2024-05-20 DIAGNOSIS — F419 Anxiety disorder, unspecified: Secondary | ICD-10-CM | POA: Diagnosis not present

## 2024-05-20 DIAGNOSIS — F0390 Unspecified dementia without behavioral disturbance: Secondary | ICD-10-CM | POA: Insufficient documentation

## 2024-05-20 DIAGNOSIS — R0602 Shortness of breath: Secondary | ICD-10-CM | POA: Insufficient documentation

## 2024-05-20 DIAGNOSIS — R457 State of emotional shock and stress, unspecified: Secondary | ICD-10-CM | POA: Diagnosis not present

## 2024-05-20 LAB — CBC WITH DIFFERENTIAL/PLATELET
Abs Immature Granulocytes: 0.04 K/uL (ref 0.00–0.07)
Basophils Absolute: 0 K/uL (ref 0.0–0.1)
Basophils Relative: 1 %
Eosinophils Absolute: 0.1 K/uL (ref 0.0–0.5)
Eosinophils Relative: 2 %
HCT: 39.2 % (ref 39.0–52.0)
Hemoglobin: 13.3 g/dL (ref 13.0–17.0)
Immature Granulocytes: 1 %
Lymphocytes Relative: 39 %
Lymphs Abs: 2.4 K/uL (ref 0.7–4.0)
MCH: 32.4 pg (ref 26.0–34.0)
MCHC: 33.9 g/dL (ref 30.0–36.0)
MCV: 95.4 fL (ref 80.0–100.0)
Monocytes Absolute: 0.5 K/uL (ref 0.1–1.0)
Monocytes Relative: 8 %
Neutro Abs: 3.2 K/uL (ref 1.7–7.7)
Neutrophils Relative %: 49 %
Platelets: 179 K/uL (ref 150–400)
RBC: 4.11 MIL/uL — ABNORMAL LOW (ref 4.22–5.81)
RDW: 13.8 % (ref 11.5–15.5)
WBC: 6.3 K/uL (ref 4.0–10.5)
nRBC: 0 % (ref 0.0–0.2)

## 2024-05-20 LAB — COMPREHENSIVE METABOLIC PANEL WITH GFR
ALT: 20 U/L (ref 0–44)
AST: 30 U/L (ref 15–41)
Albumin: 3.8 g/dL (ref 3.5–5.0)
Alkaline Phosphatase: 29 U/L — ABNORMAL LOW (ref 38–126)
Anion gap: 12 (ref 5–15)
BUN: 15 mg/dL (ref 8–23)
CO2: 23 mmol/L (ref 22–32)
Calcium: 9.3 mg/dL (ref 8.9–10.3)
Chloride: 104 mmol/L (ref 98–111)
Creatinine, Ser: 1.01 mg/dL (ref 0.61–1.24)
GFR, Estimated: >60 mL/min (ref >60)
Glucose, Bld: 126 mg/dL — ABNORMAL HIGH (ref 70–99)
Potassium: 3.7 mmol/L (ref 3.5–5.1)
Sodium: 139 mmol/L (ref 135–145)
Total Bilirubin: 0.7 mg/dL (ref 0.0–1.2)
Total Protein: 6.5 g/dL (ref 6.5–8.1)

## 2024-05-20 LAB — TROPONIN I (HIGH SENSITIVITY): Troponin I (High Sensitivity): 10 ng/L (ref <18)

## 2024-05-20 LAB — LIPASE, BLOOD: Lipase: 33 U/L (ref 11–51)

## 2024-05-20 LAB — BRAIN NATRIURETIC PEPTIDE: B Natriuretic Peptide: 61.3 pg/mL (ref 0.0–100.0)

## 2024-05-20 NOTE — ED Triage Notes (Signed)
 Pt BIB EMS from home with complaints of SOB. Per EMS wife states pt has hx of anxiety and beginning stages of dementia. Pt's VS WNL with EMS.

## 2024-05-20 NOTE — ED Notes (Signed)
 Pt and daughter given DC instructions. Both verbalized understanding of follow up care. Pt taken from ED in wheelchair by this RN.

## 2024-05-20 NOTE — Discharge Instructions (Signed)
 Your workup in the Emergency Department today was reassuring.  We did not find any specific abnormalities.  We recommend you drink plenty of fluids, take your regular medications and/or any new ones prescribed today, and follow up with the doctor(s) listed in these documents as recommended.  Return to the Emergency Department if you develop new or worsening symptoms that concern you.

## 2024-05-20 NOTE — ED Provider Notes (Signed)
 [facility] Provider Note    Event Date/Time   First MD Initiated Contact with Patient 05/20/24 [phone]     (approximate)   History   Shortness of Breath and Anxiety   HPI Jeffery Ward is a 89 y.o. male who presents with his daughter for evaluation of shortness of breath.  This occurred tonight while he was either getting ready for bed or already in bed.  He said that he felt like he could not take deep breaths and it scared him.  He said that he feels back to normal now.  His daughter who is at bedside said that he is starting to suffer from the beginning stages of dementia and is prone to anxiety.  He will only take buspirone  when he needs to but that is after the issue has already begun.  He acknowledged issues with his memory recently and said it can be very frustrating for him.  He denies any chest pain, nausea, vomiting, and abdominal pain.  No recent fever.     Physical Exam   Triage Vital Signs: ED Triage Vitals  Encounter Vitals Group     BP 05/20/24 0136 (!) 179/84     Girls Systolic BP Percentile --      Girls Diastolic BP Percentile --      Boys Systolic BP Percentile --      Boys Diastolic BP Percentile --      Pulse Rate 05/20/24 0136 (!) 52     Resp 05/20/24 0136 16     Temp 05/20/24 0136 (!) 97.5 F (36.4 C)     Temp Source 05/20/24 0136 Oral     SpO2 05/20/24 0136 100 %     Weight 05/20/24 0138 103.1 kg (227 lb 4.7 oz)     Height 05/20/24 0138 1.676 m (5' 6)     Head Circumference --      Peak Flow --      Pain Score 05/20/24 0136 0     Pain Loc --      Pain Education --      Exclude from Growth Chart --     Most recent vital signs: Vitals:   05/20/24 0136 05/20/24 0400  BP: (!) 179/84 (!) 147/74  Pulse: (!) 52 (!) 56  Resp: 16   Temp: (!) 97.5 F (36.4 C) 98 F (36.7 C)  SpO2: 100% 97%    General: Awake, no distress.  Pleasant and conversant, very talkative. CV:  Good peripheral perfusion.  Regular rate and rhythm,  normal heart sounds. Resp:  Normal effort. Speaking easily and comfortably, no accessory muscle usage nor intercostal retractions.  Lungs are clear to auscultation bilaterally. Abd:  No distention.  No tenderness to palpation of the abdomen. Other:  No appreciable focal neurological deficits.  Patient has no dysarthria nor aphasia.  He sometimes has word finding difficulties but I believe this is likely result of early stages of dementia.  However he is clearly very sharp and intelligent and able to carry on a conversation well and interacts appropriately with his daughter.   ED Results / Procedures / Treatments   Labs (all labs ordered are listed, but only abnormal results are displayed) Labs Reviewed  COMPREHENSIVE METABOLIC PANEL WITH GFR - Abnormal; Notable for the following components:      Result Value   Glucose, Bld 126 (*)    Alkaline Phosphatase 29 (*)    All other components within normal limits  CBC WITH DIFFERENTIAL/PLATELET -  Abnormal; Notable for the following components:   RBC 4.11 (*)    All other components within normal limits  LIPASE, BLOOD  BRAIN NATRIURETIC PEPTIDE  TROPONIN I (HIGH SENSITIVITY)  TROPONIN I (HIGH SENSITIVITY)     EKG  ED ECG REPORT I, Darleene Dome, the attending physician, personally viewed and interpreted this ECG.  Date: 05/20/2024 EKG Time: 1:37 AM Rate: 56 Rhythm: sinus bradycardia with first-degree AV block QRS Axis: normal Intervals: PR interval of 222 ms, otherwise unremarkable ST/T Wave abnormalities: normal Narrative Interpretation: no evidence of acute ischemia    RADIOLOGY I independently viewed and interpreted the patient's chest x-ray and I see no evidence of pneumonia or pneumothorax or pulmonary edema.  I also read the radiologist's report, which confirmed no acute findings.   PROCEDURES:  Critical Care performed: No  .1-3 Lead EKG Interpretation  Performed by: Dome Darleene, MD Authorized by: Dome Darleene, MD      Interpretation: normal     ECG rate:  60   ECG rate assessment: normal     Rhythm: sinus rhythm     Ectopy: none     Conduction: normal       IMPRESSION / MDM / ASSESSMENT AND PLAN / ED COURSE  I reviewed the triage vital signs and the nursing notes.                              Differential diagnosis includes, but is not limited to, anxiety, pneumonia, pneumothorax, pulmonary edema, ACS, PE, medication or drug side effect.  Patient's presentation is most consistent with acute presentation with potential threat to life or bodily function.  Labs/studies ordered: EKG, chest x-ray, CMP, high-sensitivity troponin, CBC with differential, lipase  Interventions/Medications given:  Medications - No data to display  (Note:  hospital course my include additional interventions and/or labs/studies not listed above.)   Patient feels well and is comfortable and very conversant on all manner of topics.  He is exhibiting no signs of dyspnea at this time.  It is likely he had a bit of an anxiety episode but is feeling better now that he is in the emergency department.  I talked with his daughter as well and we agreed to proceed with an appropriate medical screening exam and should he continue to feel better with a reassuring exam, he will be appropriate for discharge and outpatient follow-up.  He agrees with the plan as well.    Clinical Course as of 05/20/24 0544  Sat May 20, 2024  0410 Patient feels well and has been stable and asymptomatic.  His evaluation has been very reassuring with no acute abnormalities identified. [CF]  0410 I independently viewed and interpreted the patient's chest x-ray(s), and I also reviewed the radiologist's report.  There is no evidence of pneumonia, nor pulmonary edema.  Confirmed by radiologist. [CF]    Clinical Course User Index [CF] Dome Darleene, MD     FINAL CLINICAL IMPRESSION(S) / ED DIAGNOSES   Final diagnoses:  Shortness of breath  Anxiety      Rx / DC Orders   ED Discharge Orders     None        Note:  This document was prepared using Dragon voice recognition software and may include unintentional dictation errors.   Dome Darleene, MD 05/20/24 [phone]

## 2024-05-29 ENCOUNTER — Ambulatory Visit

## 2024-05-29 ENCOUNTER — Encounter: Attending: Family Medicine

## 2024-05-29 DIAGNOSIS — R0602 Shortness of breath: Secondary | ICD-10-CM | POA: Insufficient documentation

## 2024-05-29 LAB — GLUCOSE, CAPILLARY
Glucose-Capillary: 131 mg/dL — ABNORMAL HIGH (ref 70–99)
Glucose-Capillary: 104 mg/dL — ABNORMAL HIGH (ref 70–99)

## 2024-05-29 NOTE — Progress Notes (Signed)
 Daily Session Note  Patient Details  Name: Jeffery Ward MRN: [id number] Date of Birth: 09-23-1932 Referring Provider:   Flowsheet Row Pulmonary Rehab from 05/17/2024 in [facility]  Referring Provider Onita Pulling, MD    Encounter Date: 05/29/2024  Check In:  Session Check In - 05/29/24 1117       Check-In   Supervising physician immediately available to respond to emergencies See telemetry face sheet for immediately available ER MD    Location [facility]-Cardiac & Pulmonary Rehab    Staff Present Burnard Davenport RN,BSN,MPA;[facility] Dyane BS, ACSM CEP, Exercise Physiologist;Laura Cates RN,BSN;Jason Elnor RDN,LDN    Virtual Visit No    Medication changes reported     No    Fall or balance concerns reported    No    Warm-up and Cool-down Performed on first and last piece of equipment    Resistance Training Performed Yes    VAD Patient? No    PAD/SET Patient? No      Pain Assessment   Currently in Pain? No/denies             Social History   Tobacco Use  Smoking Status Never   Passive exposure: Never  Smokeless Tobacco Not on file    Goals Met:  Proper associated with RPD/PD & O2 Sat Independence with exercise equipment Using PLB without cueing & demonstrates good technique Exercise tolerated well No report of concerns or symptoms today Strength training completed today  Goals Unmet:  Not Applicable  Comments: First full day of exercise!  Patient was oriented to gym and equipment including functions, settings, policies, and procedures.  Patient's individual exercise prescription and treatment plan were reviewed.  All starting workloads were established based on the results of the 6 minute walk test done at initial orientation visit.  The plan for exercise progression was also introduced and progression will be customized based on patient's performance and goals.    Dr. Oneil Pinal is Medical Director for [facility] Cardiac  Rehabilitation.  Dr. Fuad Aleskerov is Medical Director for [facility] Pul[facility].

## 2024-05-30 ENCOUNTER — Ambulatory Visit: Payer: Self-pay | Admitting: Family Medicine

## 2024-05-30 DIAGNOSIS — Z961 Presence of intraocular lens: Secondary | ICD-10-CM | POA: Diagnosis not present

## 2024-05-31 ENCOUNTER — Ambulatory Visit

## 2024-06-05 ENCOUNTER — Ambulatory Visit

## 2024-06-07 ENCOUNTER — Ambulatory Visit

## 2024-06-07 DIAGNOSIS — E119 Type 2 diabetes mellitus without complications: Secondary | ICD-10-CM | POA: Diagnosis not present

## 2024-06-07 DIAGNOSIS — Z0001 Encounter for general adult medical examination with abnormal findings: Secondary | ICD-10-CM | POA: Diagnosis not present

## 2024-06-07 DIAGNOSIS — I10 Essential (primary) hypertension: Secondary | ICD-10-CM | POA: Diagnosis not present

## 2024-06-07 DIAGNOSIS — Z0000 Encounter for general adult medical examination without abnormal findings: Secondary | ICD-10-CM | POA: Diagnosis not present

## 2024-06-07 DIAGNOSIS — E785 Hyperlipidemia, unspecified: Secondary | ICD-10-CM | POA: Diagnosis not present

## 2024-06-07 DIAGNOSIS — I2510 Atherosclerotic heart disease of native coronary artery without angina pectoris: Secondary | ICD-10-CM | POA: Diagnosis not present

## 2024-06-07 DIAGNOSIS — F411 Generalized anxiety disorder: Secondary | ICD-10-CM | POA: Diagnosis not present

## 2024-06-07 DIAGNOSIS — K219 Gastro-esophageal reflux disease without esophagitis: Secondary | ICD-10-CM | POA: Diagnosis not present

## 2024-06-07 DIAGNOSIS — J069 Acute upper respiratory infection, unspecified: Secondary | ICD-10-CM | POA: Diagnosis not present

## 2024-06-12 ENCOUNTER — Ambulatory Visit

## 2024-06-14 ENCOUNTER — Telehealth: Payer: Self-pay | Admitting: *Deleted

## 2024-06-14 ENCOUNTER — Ambulatory Visit

## 2024-06-14 DIAGNOSIS — R0602 Shortness of breath: Secondary | ICD-10-CM

## 2024-06-14 NOTE — Telephone Encounter (Signed)
 Daughter, [name], stated he maybe back next week. She will call back to confirm.

## 2024-06-14 NOTE — Progress Notes (Signed)
 Pulmonary Individual Treatment Plan  Patient Details  Name: Jeffery Ward MRN: [id number] Date of Birth: 22-Jun-1933 Referring Provider:   Flowsheet Row Pulmonary Rehab from 05/17/2024 in [facility] Cardiac and Pulmonary Rehab  Referring Provider Onita Pulling, MD    Initial Encounter Date:  Flowsheet Row Pulmonary Rehab from 05/17/2024 in [facility] Cardiac and Pulmonary Rehab  Date 05/17/24    Visit Diagnosis: Shortness of breath  Patient's Home Medications on Admission:  Current Outpatient Medications:    acetaminophen  (TYLENOL ) 650 MG CR tablet, Take 650 mg by mouth every 8 (eight) hours as needed for pain., Disp: , Rfl:    aspirin  81 MG EC tablet, Take 81 mg by mouth daily. Swallow whole. (Patient not taking: Reported on 05/04/2024), Disp: , Rfl:    busPIRone  (BUSPAR ) 5 MG tablet, May take TID PRN anxiety, Disp: 90 tablet, Rfl: 3   Cholecalciferol (D3-1000 PO), Take by mouth., Disp: , Rfl:    clopidogrel  (PLAVIX ) 75 MG tablet, Take 1 tablet (75 mg total) by mouth daily., Disp: 90 tablet, Rfl: 1   Coenzyme Q10 (COQ10) 200 MG CAPS, Take 200 mg by mouth daily., Disp: , Rfl:    diphenhydramine-acetaminophen  (TYLENOL  PM) 25-500 MG TABS tablet, Take 1 tablet by mouth at bedtime as needed., Disp: , Rfl:    ferrous sulfate  324 (65 Fe) MG TBEC, Take by mouth., Disp: , Rfl:    fluticasone  (FLONASE ) 50 MCG/ACT nasal spray, Place 2 sprays into both nostrils daily., Disp: 11.1 mL, Rfl: 3   ketorolac  (ACULAR ) 0.5 % ophthalmic solution, Place 1 drop into the right eye 4 (four) times daily., Disp: , Rfl:    metoprolol  succinate (TOPROL -XL) 25 MG 24 hr tablet, Take 1 tablet (25 mg total) by mouth daily., Disp: 90 tablet, Rfl: 0   moxifloxacin  (VIGAMOX ) 0.5 % ophthalmic solution, Place 1 drop into the right eye 4 (four) times daily., Disp: , Rfl:    nystatin  cream (MYCOSTATIN ), Apply 1 Application topically 2 (two) times daily., Disp: 30 g, Rfl: 1   pantoprazole  (PROTONIX ) 40 MG tablet, Take 1 tablet (40 mg  total) by mouth daily., Disp: 90 tablet, Rfl: 0   potassium citrate  (UROCIT-K ) 10 MEQ (1080 MG) SR tablet, Take 1 tablet (10 mEq total) by mouth daily., Disp: 30 tablet, Rfl: 3   prednisoLONE acetate (PRED FORTE) 1 % ophthalmic suspension, Place 1 drop into the right eye 4 (four) times daily., Disp: , Rfl:    rosuvastatin  (CRESTOR ) 20 MG tablet, Take 1 tablet (20 mg total) by mouth at bedtime., Disp: 90 tablet, Rfl: 3   vitamin C (ASCORBIC ACID ) 250 MG tablet, Take 500 mg by mouth daily., Disp: , Rfl:    zinc gluconate 50 MG tablet, Take 50 mg by mouth daily., Disp: , Rfl:   Past Medical History: Past Medical History:  Diagnosis Date   Anxiety    Arthritis    back   Balanitis    BPH without obstruction/lower urinary tract symptoms    Coronary artery arteriosclerosis    Erectile dysfunction    GERD (gastroesophageal reflux disease)    Hearing difficulty    Hematuria    Hx of CABG    Hypogonadism in male    Kidney stones    Microscopic hematuria    Obesity    Primary hypertension    Urinary urgency     Tobacco Use: Social History   Tobacco Use  Smoking Status Never   Passive exposure: Never  Smokeless Tobacco Not on file  Labs: Review Flowsheet  More data may exist      Latest Ref Rng & Units 10/03/2015 10/12/2023 01/11/2024 04/21/2024 04/28/2024  Labs for ITP Cardiac and Pulmonary Rehab  Cholestrol 100 - 199 mg/dL 865  861  851  - 835   LDL (calc) 0 - 99 mg/dL 66  60  66  - 86   HDL-C >39 mg/dL 54  52  57  - 64   Trlycerides 0 - 149 mg/dL 69  843  853  - 74   Hemoglobin A1c 4.0 - 5.6 % - 6.9  6.8  6.8  -     Pulmonary Assessment Scores:  Pulmonary Assessment Scores     Row Name 05/17/24 1555 05/29/24 1705       ADL UCSD   ADL Phase -- Entry    SOB Score total -- 18    Rest -- 0    Walk -- 2    Stairs -- 0    Bath -- 0    Dress -- 2    Shop -- 0      CAT Score   CAT Score -- 16      mMRC Score   mMRC Score 2 --       UCSD: Self-administered rating  of dyspnea associated with activities of daily living (ADLs) 6-point scale (0 = not at all to 5 = maximal or unable to do because of breathlessness)  Scoring Scores range from 0 to 120.  Minimally important difference is 5 units  CAT: CAT can identify the health impairment of COPD patients and is better correlated with disease progression.  CAT has a scoring range of zero to 40. The CAT score is classified into four groups of low (less than 10), medium (10 - 20), high (21-30) and very high (31-40) based on the impact level of disease on health status. A CAT score over 10 suggests significant symptoms.  A worsening CAT score could be explained by an exacerbation, poor medication adherence, poor inhaler technique, or progression of COPD or comorbid conditions.  CAT MCID is 2 points  mMRC: mMRC (Modified Medical Research Council) Dyspnea Scale is used to assess the degree of baseline functional disability in patients of respiratory disease due to dyspnea. No minimal important difference is established. A decrease in score of 1 point or greater is considered a positive change.   Pulmonary Function Assessment:   Exercise Target Goals: Exercise Program Goal: Individual exercise prescription set using results from initial 6 min walk test and THRR while considering  patient's activity barriers and safety.   Exercise Prescription Goal: Initial exercise prescription builds to 30-45 minutes a day of aerobic activity, 2-3 days per week.  Home exercise guidelines will be given to patient during program as part of exercise prescription that the participant will acknowledge.  Education: Aerobic Exercise: - Group verbal and visual presentation on the components of exercise prescription. Introduces F.I.T.T principle from ACSM for exercise prescriptions.  Reviews F.I.T.T. principles of aerobic exercise including progression. Written material provided at class time.   Education: Resistance Exercise: -  Group verbal and visual presentation on the components of exercise prescription. Introduces F.I.T.T principle from ACSM for exercise prescriptions  Reviews F.I.T.T. principles of resistance exercise including progression. Written material provided at class time.    Education: Exercise & Equipment Safety: - Individual verbal instruction and demonstration of equipment use and safety with use of the equipment. Flowsheet Row Pulmonary Rehab from 05/17/2024 in [facility] Cardiac and Pulmonary  Rehab  Date 05/17/24  Educator MB  Instruction Review Code 1- Verbalizes Understanding    Education: Exercise Physiology & General Exercise Guidelines: - Group verbal and written instruction with models to review the exercise physiology of the cardiovascular system and associated critical values. Provides general exercise guidelines with specific guidelines to those with heart or lung disease.    Education: Flexibility, Balance, Mind/Body Relaxation: - Group verbal and visual presentation with interactive activity on the components of exercise prescription. Introduces F.I.T.T principle from ACSM for exercise prescriptions. Reviews F.I.T.T. principles of flexibility and balance exercise training including progression. Also discusses the mind body connection.  Reviews various relaxation techniques to help reduce and manage stress (i.e. Deep breathing, progressive muscle relaxation, and visualization). Balance handout provided to take home. Written material provided at class time.   Activity Barriers & Risk Stratification:  Activity Barriers & Cardiac Risk Stratification - 05/17/24 1551       Activity Barriers & Cardiac Risk Stratification   Activity Barriers Arthritis;Assistive Device          6 Minute Walk:  6 Minute Walk     Row Name 05/17/24 1549         6 Minute Walk   Phase Initial     Distance 610 feet     Walk Time 6 minutes     # of Rest Breaks 0     MPH 1.16     METS 0.14     RPE 10      Perceived Dyspnea  0     VO2 Peak 0.5     Symptoms No     Resting HR 68 bpm     Resting BP 128/70     Resting Oxygen Saturation  94 %     Exercise Oxygen Saturation  during 6 min walk 92 %     Max Ex. HR 84 bpm     Max Ex. BP 144/80     2 Minute Post BP 138/62       Interval HR   1 Minute HR 73     2 Minute HR 66     3 Minute HR 80     4 Minute HR 75     5 Minute HR 84     6 Minute HR 75     2 Minute Post HR 69     Interval Heart Rate? Yes       Interval Oxygen   Interval Oxygen? Yes     Baseline Oxygen Saturation % 94 %     1 Minute Oxygen Saturation % 94 %     1 Minute Liters of Oxygen 0 L     2 Minute Oxygen Saturation % 93 %     2 Minute Liters of Oxygen 0 L     3 Minute Oxygen Saturation % 92 %     3 Minute Liters of Oxygen 0 L     4 Minute Oxygen Saturation % 92 %     4 Minute Liters of Oxygen 0 L     5 Minute Oxygen Saturation % 93 %     5 Minute Liters of Oxygen 0 L     6 Minute Oxygen Saturation % 93 %     6 Minute Liters of Oxygen 0 L     2 Minute Post Oxygen Saturation % 94 %     2 Minute Post Liters of Oxygen 0 L       Oxygen Initial Assessment:  Oxygen Initial Assessment - 05/29/24 1120       Home Oxygen   Home Oxygen Device None    Sleep Oxygen Prescription None    Home Exercise Oxygen Prescription None    Home Resting Oxygen Prescription None      Initial 6 min Walk   Oxygen Used None      Program Oxygen Prescription   Program Oxygen Prescription None      Intervention   Short Term Goals To learn and exhibit compliance with exercise, home and travel O2 prescription;To learn and understand importance of monitoring SPO2 with pulse oximeter and demonstrate accurate use of the pulse oximeter.;To learn and understand importance of maintaining oxygen saturations>88%;To learn and demonstrate proper pursed lip breathing techniques or other breathing techniques. ;To learn and demonstrate proper use of respiratory medications    Long  Term Goals Exhibits  compliance with exercise, home  and travel O2 prescription;Maintenance of O2 saturations>88%;Compliance with respiratory medication;Demonstrates proper use of MDI's;Exhibits proper breathing techniques, such as pursed lip breathing or other method taught during program session;Verbalizes importance of monitoring SPO2 with pulse oximeter and return demonstration          Oxygen Re-Evaluation:  Oxygen Re-Evaluation     Row Name 05/29/24 1127             Program Oxygen Prescription   Program Oxygen Prescription None         Home Oxygen   Home Oxygen Device None       Sleep Oxygen Prescription None       Home Exercise Oxygen Prescription None       Home Resting Oxygen Prescription None         Goals/Expected Outcomes   Short Term Goals To learn and exhibit compliance with exercise, home and travel O2 prescription;To learn and understand importance of monitoring SPO2 with pulse oximeter and demonstrate accurate use of the pulse oximeter.;To learn and understand importance of maintaining oxygen saturations>88%;To learn and demonstrate proper pursed lip breathing techniques or other breathing techniques. ;To learn and demonstrate proper use of respiratory medications       Long  Term Goals Exhibits compliance with exercise, home  and travel O2 prescription;Maintenance of O2 saturations>88%;Compliance with respiratory medication;Demonstrates proper use of MDI's;Exhibits proper breathing techniques, such as pursed lip breathing or other method taught during program session;Verbalizes importance of monitoring SPO2 with pulse oximeter and return demonstration       Comments Reviewed PLB technique with pt.  Talked about how it works and it's importance in maintaining their exercise saturations.       Goals/Expected Outcomes Short: Become more profiecient at using PLB. Long: Become independent at using PLB.          Oxygen Discharge (Final Oxygen Re-Evaluation):  Oxygen Re-Evaluation - 05/29/24  1127       Program Oxygen Prescription   Program Oxygen Prescription None      Home Oxygen   Home Oxygen Device None    Sleep Oxygen Prescription None    Home Exercise Oxygen Prescription None    Home Resting Oxygen Prescription None      Goals/Expected Outcomes   Short Term Goals To learn and exhibit compliance with exercise, home and travel O2 prescription;To learn and understand importance of monitoring SPO2 with pulse oximeter and demonstrate accurate use of the pulse oximeter.;To learn and understand importance of maintaining oxygen saturations>88%;To learn and demonstrate proper pursed lip breathing techniques or other breathing techniques. ;To learn and demonstrate proper  use of respiratory medications    Long  Term Goals Exhibits compliance with exercise, home  and travel O2 prescription;Maintenance of O2 saturations>88%;Compliance with respiratory medication;Demonstrates proper use of MDI's;Exhibits proper breathing techniques, such as pursed lip breathing or other method taught during program session;Verbalizes importance of monitoring SPO2 with pulse oximeter and return demonstration    Comments Reviewed PLB technique with pt.  Talked about how it works and it's importance in maintaining their exercise saturations.    Goals/Expected Outcomes Short: Become more profiecient at using PLB. Long: Become independent at using PLB.          Initial Exercise Prescription:  Initial Exercise Prescription - 05/17/24 1500       Date of Initial Exercise RX and Referring Provider   Date 05/17/24    Referring Provider Ziglar, Susan, MD      Oxygen   Maintain Oxygen Saturation 88% or higher      Recumbant Bike   Level 1    RPM 50    Watts 15    Minutes 15    METs 0.14      NuStep   Level 1    SPM 80    Minutes 15    METs 0.14      Biostep-RELP   Level 1    SPM 50    Minutes 15    METs 0.14      Track   Laps 12    Minutes 15    METs 1.65      Prescription Details    Frequency (times per week) 2    Duration Progress to 30 minutes of continuous aerobic without signs/symptoms of physical distress      Intensity   THRR 40-80% of Max Heartrate 93-119    Ratings of Perceived Exertion 11-13    Perceived Dyspnea 0-4      Progression   Progression Continue to progress workloads to maintain intensity without signs/symptoms of physical distress.      Resistance Training   Training Prescription Yes    Weight 3 lb    Reps 10-15          Perform Capillary Blood Glucose checks as needed.  Exercise Prescription Changes:   Exercise Prescription Changes     Row Name 05/17/24 1500 06/08/24 0700           Response to Exercise   Blood Pressure (Admit) 128/70 120/76      Blood Pressure (Exercise) 144/80 122/68      Blood Pressure (Exit) 138/62 124/60      Heart Rate (Admit) 68 bpm 67 bpm      Heart Rate (Exercise) 84 bpm 72 bpm      Heart Rate (Exit) 69 bpm 71 bpm      Oxygen Saturation (Admit) 94 % 92 %      Oxygen Saturation (Exercise) 92 % 90 %      Oxygen Saturation (Exit) 94 % 93 %      Rating of Perceived Exertion (Exercise) 10 12      Perceived Dyspnea (Exercise) 0 1      Symptoms none none      Comments results 1st day of exercise      Duration -- Progress to 30 minutes of  aerobic without signs/symptoms of physical distress      Intensity -- THRR unchanged        Progression   Progression -- Continue to progress workloads to maintain intensity without signs/symptoms of physical  distress.      Average METs 0.14 1.39        Resistance Training   Training Prescription -- Yes      Weight -- 3 lb      Reps -- 10-15        Interval Training   Interval Training -- No        NuStep   Level -- 1      Minutes -- 15      METs -- 1.5        Track   Laps -- 6  hallway      Minutes -- 15      METs -- 1.27        Oxygen   Maintain Oxygen Saturation -- 88% or higher         Exercise Comments:   Exercise Comments     Row  Name 05/29/24 1117           Exercise Comments First full day of exercise!  Patient was oriented to gym and equipment including functions, settings, policies, and procedures.  Patient's individual exercise prescription and treatment plan were reviewed.  All starting workloads were established based on the results of the 6 minute walk test done at initial orientation visit.  The plan for exercise progression was also introduced and progression will be customized based on patient's performance and goals.          Exercise Goals and Review:   Exercise Goals     Row Name 05/17/24 1554             Exercise Goals   Increase Physical Activity Yes       Intervention Provide advice, education, support and counseling about physical activity/exercise needs.;Develop an individualized exercise prescription for aerobic and resistive training based on initial evaluation findings, risk stratification, comorbidities and participant's personal goals.       Expected Outcomes Short Term: Attend rehab on a regular basis to increase amount of physical activity.;Long Term: Add in home exercise to make exercise part of routine and to increase amount of physical activity.;Long Term: Exercising regularly at least 3-5 days a week.       Increase Strength and Stamina Yes       Intervention Provide advice, education, support and counseling about physical activity/exercise needs.;Develop an individualized exercise prescription for aerobic and resistive training based on initial evaluation findings, risk stratification, comorbidities and participant's personal goals.       Expected Outcomes Short Term: Increase workloads from initial exercise prescription for resistance, speed, and METs.;Short Term: Perform resistance training exercises routinely during rehab and add in resistance training at home;Long Term: Improve cardiorespiratory fitness, muscular endurance and strength as measured by increased METs and functional  capacity ( )       Able to understand and use rate of perceived exertion (RPE) scale Yes       Intervention Provide education and explanation on how to use RPE scale       Expected Outcomes Short Term: Able to use RPE daily in rehab to express subjective intensity level;Long Term:  Able to use RPE to guide intensity level when exercising independently       Able to understand and use Dyspnea scale Yes       Intervention Provide education and explanation on how to use Dyspnea scale       Expected Outcomes Short Term: Able to use Dyspnea scale daily in rehab to express subjective sense of shortness of  breath during exertion;Long Term: Able to use Dyspnea scale to guide intensity level when exercising independently       Knowledge and understanding of Target Heart Rate Range (THRR) Yes       Intervention Provide education and explanation of THRR including how the numbers were predicted and where they are located for reference       Expected Outcomes Short Term: Able to state/look up THRR;Short Term: Able to use daily as guideline for intensity in rehab;Long Term: Able to use THRR to govern intensity when exercising independently       Able to check pulse independently Yes       Intervention Provide education and demonstration on how to check pulse in carotid and radial arteries.;Review the importance of being able to check your own pulse for safety during independent exercise       Expected Outcomes Short Term: Able to explain why pulse checking is important during independent exercise;Long Term: Able to check pulse independently and accurately       Understanding of Exercise Prescription Yes       Intervention Provide education, explanation, and written materials on patient's individual exercise prescription       Expected Outcomes Short Term: Able to explain program exercise prescription;Long Term: Able to explain home exercise prescription to exercise independently          Exercise Goals  Re-Evaluation :  Exercise Goals Re-Evaluation     Row Name 05/29/24 1118 06/08/24 0750           Exercise Goal Re-Evaluation   Exercise Goals Review Increase Physical Activity;Able to understand and use rate of perceived exertion (RPE) scale;Knowledge and understanding of Target Heart Rate Range (THRR);Understanding of Exercise Prescription;Increase Strength and Stamina;Able to understand and use Dyspnea scale;Able to check pulse independently Increase Physical Activity;Understanding of Exercise Prescription;Increase Strength and Stamina      Comments Reviewed RPE and dyspnea scale, THR and program prescription with pt today.  Pt voiced understanding and was given a copy of goals to take home. Jeffery Ward is off to a good start in the program and completed his first day in this review. He weas able to do level 1 on the T4 nustep and 6 laps on the hallway track. We will continue to monitor his progress in the program.      Expected Outcomes Short: Use RPE daily to regulate intensity. Long: Follow program prescription in THR. Short: Continue to follow current exercise prescription. Long: Continue exercise to improve strength and stamina.         Discharge Exercise Prescription (Final Exercise Prescription Changes):  Exercise Prescription Changes - 06/08/24 0700       Response to Exercise   Blood Pressure (Admit) 120/76    Blood Pressure (Exercise) 122/68    Blood Pressure (Exit) 124/60    Heart Rate (Admit) 67 bpm    Heart Rate (Exercise) 72 bpm    Heart Rate (Exit) 71 bpm    Oxygen Saturation (Admit) 92 %    Oxygen Saturation (Exercise) 90 %    Oxygen Saturation (Exit) 93 %    Rating of Perceived Exertion (Exercise) 12    Perceived Dyspnea (Exercise) 1    Symptoms none    Comments 1st day of exercise    Duration Progress to 30 minutes of  aerobic without signs/symptoms of physical distress    Intensity THRR unchanged      Progression   Progression Continue to progress workloads to  maintain intensity without  signs/symptoms of physical distress.    Average METs 1.39      Resistance Training   Training Prescription Yes    Weight 3 lb    Reps 10-15      Interval Training   Interval Training No      NuStep   Level 1    Minutes 15    METs 1.5      Track   Laps 6   hallway   Minutes 15    METs 1.27      Oxygen   Maintain Oxygen Saturation 88% or higher          Nutrition:  Target Goals: Understanding of nutrition guidelines, daily intake of sodium 1500mg , cholesterol 200mg , calories 30% from fat and 7% or less from saturated fats, daily to have 5 or more servings of fruits and vegetables.  Education: Nutrition 1 -Group instruction provided by verbal, written material, interactive activities, discussions, models, and posters to present general guidelines for heart healthy nutrition including macronutrients, label reading, and promoting whole foods over processed counterparts. Education serves as Pensions consultant of discussion of heart healthy eating for all. Written material provided at class time.     Education: Nutrition 2 -Group instruction provided by verbal, written material, interactive activities, discussions, models, and posters to present general guidelines for heart healthy nutrition including sodium, cholesterol, and saturated fat. Providing guidance of habit forming to improve blood pressure, cholesterol, and body weight. Written material provided at class time.     Biometrics:  Pre Biometrics - 05/17/24 1554       Pre Biometrics   Height 5' 6.61 (1.692 m)    Weight 227 lb 6.4 oz (103.1 kg)    Waist Circumference 50 inches    Hip Circumference 50 inches    Waist to Hip Ratio 1 %    BMI (Calculated) 36.03    Single Leg Stand 2.5 seconds           Nutrition Therapy Plan and Nutrition Goals:  Nutrition Therapy & Goals - 05/17/24 1555       Personal Nutrition Goals   Nutrition Goal Will meet w/ RD on 9/8      Intervention Plan    Intervention Prescribe, educate and counsel regarding individualized specific dietary modifications aiming towards targeted core components such as weight, hypertension, lipid management, diabetes, heart failure and other comorbidities.    Expected Outcomes Short Term Goal: Understand basic principles of dietary content, such as calories, fat, sodium, cholesterol and nutrients.          Nutrition Assessments:  MEDIFICTS Score Key: >=70 Need to make dietary changes  40-70 Heart Healthy Diet <= 40 Therapeutic Level Cholesterol Diet  Flowsheet Row Pulmonary Rehab from 05/29/2024 in [facility] Cardiac and Pulmonary Rehab  Picture Your Plate Total Score on Admission 40   Picture Your Plate Scores: <59 Unhealthy dietary pattern with much room for improvement. 41-50 Dietary pattern unlikely to meet recommendations for good health and room for improvement. 51-60 More healthful dietary pattern, with some room for improvement.  >60 Healthy dietary pattern, although there may be some specific behaviors that could be improved.   Nutrition Goals Re-Evaluation:   Nutrition Goals Discharge (Final Nutrition Goals Re-Evaluation):   Psychosocial: Target Goals: Acknowledge presence or absence of significant depression and/or stress, maximize coping skills, provide positive support system. Participant is able to verbalize types and ability to use techniques and skills needed for reducing stress and depression.   Education: Stress, Anxiety, and Depression -  Group verbal and visual presentation to define topics covered.  Reviews how body is impacted by stress, anxiety, and depression.  Also discusses healthy ways to reduce stress and to treat/manage anxiety and depression.  Written material provided at class time.   Education: Sleep Hygiene -Provides group verbal and written instruction about how sleep can affect your health.  Define sleep hygiene, discuss sleep cycles and impact of sleep habits. Review good  sleep hygiene tips.    Initial Review & Psychosocial Screening:  Initial Psych Review & Screening - 05/10/24 1349       Initial Review   Current issues with Current Anxiety/Panic;Current Psychotropic Meds      Family Dynamics   Good Support System? Yes    Comments Jeffery Ward states he has some anxiety from being couped up in his house. He can look to Jeffery Ward his care giver/daugher and her husband.      Barriers   Psychosocial barriers to participate in program The patient should benefit from training in stress management and relaxation.      Screening Interventions   Interventions Encouraged to exercise;To provide support and resources with identified psychosocial needs;Provide feedback about the scores to participant    Expected Outcomes Short Term goal: Utilizing psychosocial counselor, staff and physician to assist with identification of specific Stressors or current issues interfering with healing process. Setting desired goal for each stressor or current issue identified.;Long Term Goal: Stressors or current issues are controlled or eliminated.;Short Term goal: Identification and review with participant of any Quality of Life or Depression concerns found by scoring the questionnaire.;Long Term goal: The participant improves quality of Life and PHQ9 Scores as seen by post scores and/or verbalization of changes          Quality of Life Scores:  Scores of 19 and below usually indicate a poorer quality of life in these areas.  A difference of  2-3 points is a clinically meaningful difference.  A difference of 2-3 points in the total score of the Quality of Life Index has been associated with significant improvement in overall quality of life, self-image, physical symptoms, and general health in studies assessing change in quality of life.  PHQ-9: Review Flowsheet       05/17/2024 10/12/2023  Depression screen PHQ 2/9  Decreased Interest 3 2  Down, Depressed, Hopeless 0 2  PHQ - 2 Score  3 4  Altered sleeping 1 2  Tired, decreased energy 2 3  Change in appetite 0 0  Feeling bad or failure about yourself  0 0  Trouble concentrating 0 0  Moving slowly or fidgety/restless 0 1  Suicidal thoughts 0 0  PHQ-9 Score 6 10  Difficult doing work/chores Not difficult at all Somewhat difficult   Interpretation of Total Score  Total Score Depression Severity:  1-4 = Minimal depression, 5-9 = Mild depression, 10-14 = Moderate depression, 15-19 = Moderately severe depression, 20-27 = Severe depression   Psychosocial Evaluation and Intervention:  Psychosocial Evaluation - 05/10/24 1353       Psychosocial Evaluation & Interventions   Interventions Encouraged to exercise with the program and follow exercise prescription;Relaxation education;Stress management education    Comments Jeffery Ward states he has some anxiety from being couped up in his house. He can look to Jeffery Ward his care giver and her husband.    Expected Outcomes Short: Start [facility] to help with mood. Long: Maintain a healthy mental state    Continue Psychosocial Services  Follow up required by staff  Psychosocial Re-Evaluation:   Psychosocial Discharge (Final Psychosocial Re-Evaluation):   Education: Education Goals: Education classes will be provided on a weekly basis, covering required topics. Participant will state understanding/return demonstration of topics presented.  Learning Barriers/Preferences:  Learning Barriers/Preferences - 05/10/24 1349       Learning Barriers/Preferences   Learning Barriers Hearing;Sight    Learning Preferences None          General Pulmonary Education Topics:  Infection Prevention: - Provides verbal and written material to individual with discussion of infection control including proper hand washing and proper equipment cleaning during exercise session. Flowsheet Row Pulmonary Rehab from 05/17/2024 in [facility] Cardiac and Pulmonary Rehab  Date 05/17/24  Educator MB   Instruction Review Code 1- Verbalizes Understanding    Falls Prevention: - Provides verbal and written material to individual with discussion of falls prevention and safety. Flowsheet Row Pulmonary Rehab from 05/17/2024 in [facility] Cardiac and Pulmonary Rehab  Date 05/17/24  Educator MB  Instruction Review Code 1- Verbalizes Understanding    Chronic Lung Disease Review: - Group verbal instruction with posters, models, PowerPoint presentations and videos,  to review new updates, new respiratory medications, new advancements in procedures and treatments. Providing information on websites and 800 numbers for continued self-education. Includes information about supplement oxygen, available portable oxygen systems, continuous and intermittent flow rates, oxygen safety, concentrators, and Medicare reimbursement for oxygen. Explanation of Pulmonary Drugs, including class, frequency, complications, importance of spacers, rinsing mouth after steroid MDI's, and proper cleaning methods for nebulizers. Review of basic lung anatomy and physiology related to function, structure, and complications of lung disease. Review of risk factors. Discussion about methods for diagnosing sleep apnea and types of masks and machines for OSA. Includes a review of the use of types of environmental controls: home humidity, furnaces, filters, dust mite/pet prevention, HEPA vacuums. Discussion about weather changes, air quality and the benefits of nasal washing. Instruction on Warning signs, infection symptoms, calling MD promptly, preventive modes, and value of vaccinations. Review of effective airway clearance, coughing and/or vibration techniques. Emphasizing that all should Create an Action Plan. Written material provided at class time. Flowsheet Row Pulmonary Rehab from 05/29/2024 in [facility] Cardiac and Pulmonary Rehab  Education need identified 05/29/24    AED/CPR: - Group verbal and written instruction with the use of models to  demonstrate the basic use of the AED with the basic ABC's of resuscitation.    Tests and Procedures:  - Group verbal and visual presentation and models provide information about basic cardiac anatomy and function. Reviews the testing methods done to diagnose heart disease and the outcomes of the test results. Describes the treatment choices: Medical Management, Angioplasty, or Coronary Bypass Surgery for treating various heart conditions including Myocardial Infarction, Angina, Valve Disease, and Cardiac Arrhythmias.  Written material provided at class time.   Medication Safety: - Group verbal and visual instruction to review commonly prescribed medications for heart and lung disease. Reviews the medication, class of the drug, and side effects. Includes the steps to properly store meds and maintain the prescription regimen.  Written material given at graduation.   Other: -Provides group and verbal instruction on various topics (see comments)   Knowledge Questionnaire Score:  Knowledge Questionnaire Score - 05/29/24 1708       Knowledge Questionnaire Score   Pre Score 9/18           Core Components/Risk Factors/Patient Goals at Admission:  Personal Goals and Risk Factors at Admission - 05/17/24 1555  Core Components/Risk Factors/Patient Goals on Admission    Weight Management Yes;Weight Loss    Intervention Weight Management: Provide education and appropriate resources to help participant work on and attain dietary goals.;Weight Management/Obesity: Establish reasonable short term and long term weight goals.;Weight Management: Develop a combined nutrition and exercise program designed to reach desired caloric intake, while maintaining appropriate intake of nutrient and fiber, sodium and fats, and appropriate energy expenditure required for the weight goal.    Admit Weight 227 lb 6.4 oz (103.1 kg)    Goal Weight: Short Term 208 lb (94.3 kg)    Goal Weight: Long Term 190 lb (86.2  kg)    Expected Outcomes Short Term: Continue to assess and modify interventions until short term weight is achieved;Long Term: Adherence to nutrition and physical activity/exercise program aimed toward attainment of established weight goal;Weight Loss: Understanding of general recommendations for a balanced deficit meal plan, which promotes 1-2 lb weight loss per week and includes a negative energy balance of [phone] kcal/d;Understanding of distribution of calorie intake throughout the day with the consumption of 4-5 meals/snacks;Understanding recommendations for meals to include 15-35% energy as protein, 25-35% energy from fat, 35-60% energy from carbohydrates, less than 200mg  of dietary cholesterol, 20-35 gm of total fiber daily    Improve shortness of breath with ADL's Yes    Intervention Provide education, individualized exercise plan and daily activity instruction to help decrease symptoms of SOB with activities of daily living.    Expected Outcomes Short Term: Improve cardiorespiratory fitness to achieve a reduction of symptoms when performing ADLs;Long Term: Be able to perform more ADLs without symptoms or delay the onset of symptoms    Hypertension Yes    Intervention Provide education on lifestyle modifcations including regular physical activity/exercise, weight management, moderate sodium restriction and increased consumption of fresh fruit, vegetables, and low fat dairy, alcohol moderation, and smoking cessation.;Monitor prescription use compliance.    Expected Outcomes Short Term: Continued assessment and intervention until BP is < 140/81mm HG in hypertensive participants. < 130/3mm HG in hypertensive participants with diabetes, heart failure or chronic kidney disease.;Long Term: Maintenance of blood pressure at goal levels.    Lipids Yes    Intervention Provide education and support for participant on nutrition & aerobic/resistive exercise along with prescribed medications to achieve LDL  70mg , HDL >40mg .    Expected Outcomes Short Term: Participant states understanding of desired cholesterol values and is compliant with medications prescribed. Participant is following exercise prescription and nutrition guidelines.;Long Term: Cholesterol controlled with medications as prescribed, with individualized exercise RX and with personalized nutrition plan. Value goals: LDL < 70mg , HDL > 40 mg.          Education:Diabetes - Individual verbal and written instruction to review signs/symptoms of diabetes, desired ranges of glucose level fasting, after meals and with exercise. Acknowledge that pre and post exercise glucose checks will be done for 3 sessions at entry of program.   Know Your Numbers and Heart Failure: - Group verbal and visual instruction to discuss disease risk factors for cardiac and pulmonary disease and treatment options.  Reviews associated critical values for Overweight/Obesity, Hypertension, Cholesterol, and Diabetes.  Discusses basics of heart failure: signs/symptoms and treatments.  Introduces Heart Failure Zone chart for action plan for heart failure. Written material provided at class time.   Core Components/Risk Factors/Patient Goals Review:    Core Components/Risk Factors/Patient Goals at Discharge (Final Review):    ITP Comments:  ITP Comments     Row Name 05/10/24  1400 05/17/24 1549 05/29/24 1117 06/14/24 1037     ITP Comments Virtual Visit completed. Patient informed on EP and RD appointment and 6 Minute walk test. Patient also informed of patient health questionnaires on My Chart. Patient Verbalizes understanding. Visit diagnosis can be found in [facility] 01/11/2024. Completed and gym orientation for respiratory care services. Initial ITP created and sent for review to Dr. Faud Aleskerov, Medical Director. First full day of exercise!  Patient was oriented to gym and equipment including functions, settings, policies, and procedures.  Patient's individual  exercise prescription and treatment plan were reviewed.  All starting workloads were established based on the results of the 6 minute walk test done at initial orientation visit.  The plan for exercise progression was also introduced and progression will be customized based on patient's performance and goals. 30 Day review completed. Medical Director ITP review done; changes made as directed and signed approval by Medical Director. New to program.       Comments: 30 day review

## 2024-06-19 ENCOUNTER — Ambulatory Visit

## 2024-06-21 ENCOUNTER — Ambulatory Visit

## 2024-06-26 ENCOUNTER — Ambulatory Visit

## 2024-06-28 ENCOUNTER — Ambulatory Visit

## 2024-07-03 ENCOUNTER — Ambulatory Visit

## 2024-07-03 ENCOUNTER — Ambulatory Visit: Admitting: Cardiovascular Disease

## 2024-07-04 ENCOUNTER — Ambulatory Visit: Admitting: Cardiovascular Disease

## 2024-07-05 ENCOUNTER — Ambulatory Visit

## 2024-07-05 DIAGNOSIS — R0602 Shortness of breath: Secondary | ICD-10-CM

## 2024-07-05 NOTE — Progress Notes (Signed)
 Early Discharge Summary  Jeffery Ward [birth year]  [name] is discharging early due to lack of attendance. He completed 2 of 36 sessions.    6 Minute Walk     Row Name 05/17/24 1549         6 Minute Walk   Phase Initial     Distance 610 feet     Walk Time 6 minutes     # of Rest Breaks 0     MPH 1.16     METS 0.14     RPE 10     Perceived Dyspnea  0     VO2 Peak 0.5     Symptoms No     Resting HR 68 bpm     Resting BP 128/70     Resting Oxygen Saturation  94 %     Exercise Oxygen Saturation  during 6 min walk 92 %     Max Ex. HR 84 bpm     Max Ex. BP 144/80     2 Minute Post BP 138/62       Interval HR   1 Minute HR 73     2 Minute HR 66     3 Minute HR 80     4 Minute HR 75     5 Minute HR 84     6 Minute HR 75     2 Minute Post HR 69     Interval Heart Rate? Yes       Interval Oxygen   Interval Oxygen? Yes     Baseline Oxygen Saturation % 94 %     1 Minute Oxygen Saturation % 94 %     1 Minute Liters of Oxygen 0 L     2 Minute Oxygen Saturation % 93 %     2 Minute Liters of Oxygen 0 L     3 Minute Oxygen Saturation % 92 %     3 Minute Liters of Oxygen 0 L     4 Minute Oxygen Saturation % 92 %     4 Minute Liters of Oxygen 0 L     5 Minute Oxygen Saturation % 93 %     5 Minute Liters of Oxygen 0 L     6 Minute Oxygen Saturation % 93 %     6 Minute Liters of Oxygen 0 L     2 Minute Post Oxygen Saturation % 94 %     2 Minute Post Liters of Oxygen 0 L

## 2024-07-05 NOTE — Progress Notes (Signed)
 Pulmonary Individual Treatment Plan  Patient Details  Name: Jeffery Ward MRN: [id number] Date of Birth: December 10, 1932 Referring Provider:   Flowsheet Row Pulmonary Rehab from 05/17/2024 in [facility] Cardiac and Pulmonary Rehab  Referring Provider Onita Pulling, MD    Initial Encounter Date:  Flowsheet Row Pulmonary Rehab from 05/17/2024 in [facility] Cardiac and Pulmonary Rehab  Date 05/17/24    Visit Diagnosis: Shortness of breath  Patient's Home Medications on Admission:  Current Outpatient Medications:    acetaminophen  (TYLENOL ) 650 MG CR tablet, Take 650 mg by mouth every 8 (eight) hours as needed for pain., Disp: , Rfl:    aspirin  81 MG EC tablet, Take 81 mg by mouth daily. Swallow whole. (Patient not taking: Reported on 05/04/2024), Disp: , Rfl:    busPIRone  (BUSPAR ) 5 MG tablet, May take TID PRN anxiety, Disp: 90 tablet, Rfl: 3   Cholecalciferol (D3-1000 PO), Take by mouth., Disp: , Rfl:    clopidogrel  (PLAVIX ) 75 MG tablet, Take 1 tablet (75 mg total) by mouth daily., Disp: 90 tablet, Rfl: 1   Coenzyme Q10 (COQ10) 200 MG CAPS, Take 200 mg by mouth daily., Disp: , Rfl:    diphenhydramine-acetaminophen  (TYLENOL  PM) 25-500 MG TABS tablet, Take 1 tablet by mouth at bedtime as needed., Disp: , Rfl:    ferrous sulfate  324 (65 Fe) MG TBEC, Take by mouth., Disp: , Rfl:    fluticasone  (FLONASE ) 50 MCG/ACT nasal spray, Place 2 sprays into both nostrils daily., Disp: 11.1 mL, Rfl: 3   ketorolac  (ACULAR ) 0.5 % ophthalmic solution, Place 1 drop into the right eye 4 (four) times daily., Disp: , Rfl:    metoprolol  succinate (TOPROL -XL) 25 MG 24 hr tablet, Take 1 tablet (25 mg total) by mouth daily., Disp: 90 tablet, Rfl: 0   moxifloxacin  (VIGAMOX ) 0.5 % ophthalmic solution, Place 1 drop into the right eye 4 (four) times daily., Disp: , Rfl:    nystatin  cream (MYCOSTATIN ), Apply 1 Application topically 2 (two) times daily., Disp: 30 g, Rfl: 1   pantoprazole  (PROTONIX ) 40 MG tablet, Take 1 tablet (40 mg  total) by mouth daily., Disp: 90 tablet, Rfl: 0   potassium citrate  (UROCIT-K ) 10 MEQ (1080 MG) SR tablet, Take 1 tablet (10 mEq total) by mouth daily., Disp: 30 tablet, Rfl: 3   prednisoLONE acetate (PRED FORTE) 1 % ophthalmic suspension, Place 1 drop into the right eye 4 (four) times daily., Disp: , Rfl:    rosuvastatin  (CRESTOR ) 20 MG tablet, Take 1 tablet (20 mg total) by mouth at bedtime., Disp: 90 tablet, Rfl: 3   vitamin C (ASCORBIC ACID ) 250 MG tablet, Take 500 mg by mouth daily., Disp: , Rfl:    zinc gluconate 50 MG tablet, Take 50 mg by mouth daily., Disp: , Rfl:   Past Medical History: Past Medical History:  Diagnosis Date   Anxiety    Arthritis    back   Balanitis    BPH without obstruction/lower urinary tract symptoms    Coronary artery arteriosclerosis    Erectile dysfunction    GERD (gastroesophageal reflux disease)    Hearing difficulty    Hematuria    Hx of CABG    Hypogonadism in male    Kidney stones    Microscopic hematuria    Obesity    Primary hypertension    Urinary urgency     Tobacco Use: Social History   Tobacco Use  Smoking Status Never   Passive exposure: Never  Smokeless Tobacco Not on file  Labs: Review Flowsheet  More data may exist      Latest Ref Rng & Units 10/03/2015 10/12/2023 01/11/2024 04/21/2024 04/28/2024  Labs for ITP Cardiac and Pulmonary Rehab  Cholestrol 100 - 199 mg/dL 865  861  851  - 835   LDL (calc) 0 - 99 mg/dL 66  60  66  - 86   HDL-C >39 mg/dL 54  52  57  - 64   Trlycerides 0 - 149 mg/dL 69  843  853  - 74   Hemoglobin A1c 4.0 - 5.6 % - 6.9  6.8  6.8  -     Pulmonary Assessment Scores:  Pulmonary Assessment Scores     Row Name 05/17/24 1555 05/29/24 1705       ADL UCSD   ADL Phase -- Entry    SOB Score total -- 18    Rest -- 0    Walk -- 2    Stairs -- 0    Bath -- 0    Dress -- 2    Shop -- 0      CAT Score   CAT Score -- 16      mMRC Score   mMRC Score 2 --       UCSD: Self-administered rating  of dyspnea associated with activities of daily living (ADLs) 6-point scale (0 = not at all to 5 = maximal or unable to do because of breathlessness)  Scoring Scores range from 0 to 120.  Minimally important difference is 5 units  CAT: CAT can identify the health impairment of COPD patients and is better correlated with disease progression.  CAT has a scoring range of zero to 40. The CAT score is classified into four groups of low (less than 10), medium (10 - 20), high (21-30) and very high (31-40) based on the impact level of disease on health status. A CAT score over 10 suggests significant symptoms.  A worsening CAT score could be explained by an exacerbation, poor medication adherence, poor inhaler technique, or progression of COPD or comorbid conditions.  CAT MCID is 2 points  mMRC: mMRC (Modified Medical Research Council) Dyspnea Scale is used to assess the degree of baseline functional disability in patients of respiratory disease due to dyspnea. No minimal important difference is established. A decrease in score of 1 point or greater is considered a positive change.   Pulmonary Function Assessment:   Exercise Target Goals: Exercise Program Goal: Individual exercise prescription set using results from initial 6 min walk test and THRR while considering  patient's activity barriers and safety.   Exercise Prescription Goal: Initial exercise prescription builds to 30-45 minutes a day of aerobic activity, 2-3 days per week.  Home exercise guidelines will be given to patient during program as part of exercise prescription that the participant will acknowledge.  Education: Aerobic Exercise: - Group verbal and visual presentation on the components of exercise prescription. Introduces F.I.T.T principle from ACSM for exercise prescriptions.  Reviews F.I.T.T. principles of aerobic exercise including progression. Written material provided at class time.   Education: Resistance Exercise: -  Group verbal and visual presentation on the components of exercise prescription. Introduces F.I.T.T principle from ACSM for exercise prescriptions  Reviews F.I.T.T. principles of resistance exercise including progression. Written material provided at class time.    Education: Exercise & Equipment Safety: - Individual verbal instruction and demonstration of equipment use and safety with use of the equipment. Flowsheet Row Pulmonary Rehab from 05/17/2024 in [facility] Cardiac and Pulmonary  Rehab  Date 05/17/24  Educator MB  Instruction Review Code 1- Verbalizes Understanding    Education: Exercise Physiology & General Exercise Guidelines: - Group verbal and written instruction with models to review the exercise physiology of the cardiovascular system and associated critical values. Provides general exercise guidelines with specific guidelines to those with heart or lung disease.    Education: Flexibility, Balance, Mind/Body Relaxation: - Group verbal and visual presentation with interactive activity on the components of exercise prescription. Introduces F.I.T.T principle from ACSM for exercise prescriptions. Reviews F.I.T.T. principles of flexibility and balance exercise training including progression. Also discusses the mind body connection.  Reviews various relaxation techniques to help reduce and manage stress (i.e. Deep breathing, progressive muscle relaxation, and visualization). Balance handout provided to take home. Written material provided at class time.   Activity Barriers & Risk Stratification:  Activity Barriers & Cardiac Risk Stratification - 05/17/24 1551       Activity Barriers & Cardiac Risk Stratification   Activity Barriers Arthritis;Assistive Device          6 Minute Walk:  6 Minute Walk     Row Name 05/17/24 1549         6 Minute Walk   Phase Initial     Distance 610 feet     Walk Time 6 minutes     # of Rest Breaks 0     MPH 1.16     METS 0.14     RPE 10      Perceived Dyspnea  0     VO2 Peak 0.5     Symptoms No     Resting HR 68 bpm     Resting BP 128/70     Resting Oxygen Saturation  94 %     Exercise Oxygen Saturation  during 6 min walk 92 %     Max Ex. HR 84 bpm     Max Ex. BP 144/80     2 Minute Post BP 138/62       Interval HR   1 Minute HR 73     2 Minute HR 66     3 Minute HR 80     4 Minute HR 75     5 Minute HR 84     6 Minute HR 75     2 Minute Post HR 69     Interval Heart Rate? Yes       Interval Oxygen   Interval Oxygen? Yes     Baseline Oxygen Saturation % 94 %     1 Minute Oxygen Saturation % 94 %     1 Minute Liters of Oxygen 0 L     2 Minute Oxygen Saturation % 93 %     2 Minute Liters of Oxygen 0 L     3 Minute Oxygen Saturation % 92 %     3 Minute Liters of Oxygen 0 L     4 Minute Oxygen Saturation % 92 %     4 Minute Liters of Oxygen 0 L     5 Minute Oxygen Saturation % 93 %     5 Minute Liters of Oxygen 0 L     6 Minute Oxygen Saturation % 93 %     6 Minute Liters of Oxygen 0 L     2 Minute Post Oxygen Saturation % 94 %     2 Minute Post Liters of Oxygen 0 L       Oxygen Initial Assessment:  Oxygen Initial Assessment - 05/29/24 1120       Home Oxygen   Home Oxygen Device None    Sleep Oxygen Prescription None    Home Exercise Oxygen Prescription None    Home Resting Oxygen Prescription None      Initial 6 min Walk   Oxygen Used None      Program Oxygen Prescription   Program Oxygen Prescription None      Intervention   Short Term Goals To learn and exhibit compliance with exercise, home and travel O2 prescription;To learn and understand importance of monitoring SPO2 with pulse oximeter and demonstrate accurate use of the pulse oximeter.;To learn and understand importance of maintaining oxygen saturations>88%;To learn and demonstrate proper pursed lip breathing techniques or other breathing techniques. ;To learn and demonstrate proper use of respiratory medications    Long  Term Goals Exhibits  compliance with exercise, home  and travel O2 prescription;Maintenance of O2 saturations>88%;Compliance with respiratory medication;Demonstrates proper use of MDI's;Exhibits proper breathing techniques, such as pursed lip breathing or other method taught during program session;Verbalizes importance of monitoring SPO2 with pulse oximeter and return demonstration          Oxygen Re-Evaluation:  Oxygen Re-Evaluation     Row Name 05/29/24 1127             Program Oxygen Prescription   Program Oxygen Prescription None         Home Oxygen   Home Oxygen Device None       Sleep Oxygen Prescription None       Home Exercise Oxygen Prescription None       Home Resting Oxygen Prescription None         Goals/Expected Outcomes   Short Term Goals To learn and exhibit compliance with exercise, home and travel O2 prescription;To learn and understand importance of monitoring SPO2 with pulse oximeter and demonstrate accurate use of the pulse oximeter.;To learn and understand importance of maintaining oxygen saturations>88%;To learn and demonstrate proper pursed lip breathing techniques or other breathing techniques. ;To learn and demonstrate proper use of respiratory medications       Long  Term Goals Exhibits compliance with exercise, home  and travel O2 prescription;Maintenance of O2 saturations>88%;Compliance with respiratory medication;Demonstrates proper use of MDI's;Exhibits proper breathing techniques, such as pursed lip breathing or other method taught during program session;Verbalizes importance of monitoring SPO2 with pulse oximeter and return demonstration       Comments Reviewed PLB technique with pt.  Talked about how it works and it's importance in maintaining their exercise saturations.       Goals/Expected Outcomes Short: Become more profiecient at using PLB. Long: Become independent at using PLB.          Oxygen Discharge (Final Oxygen Re-Evaluation):  Oxygen Re-Evaluation - 05/29/24  1127       Program Oxygen Prescription   Program Oxygen Prescription None      Home Oxygen   Home Oxygen Device None    Sleep Oxygen Prescription None    Home Exercise Oxygen Prescription None    Home Resting Oxygen Prescription None      Goals/Expected Outcomes   Short Term Goals To learn and exhibit compliance with exercise, home and travel O2 prescription;To learn and understand importance of monitoring SPO2 with pulse oximeter and demonstrate accurate use of the pulse oximeter.;To learn and understand importance of maintaining oxygen saturations>88%;To learn and demonstrate proper pursed lip breathing techniques or other breathing techniques. ;To learn and demonstrate proper  use of respiratory medications    Long  Term Goals Exhibits compliance with exercise, home  and travel O2 prescription;Maintenance of O2 saturations>88%;Compliance with respiratory medication;Demonstrates proper use of MDI's;Exhibits proper breathing techniques, such as pursed lip breathing or other method taught during program session;Verbalizes importance of monitoring SPO2 with pulse oximeter and return demonstration    Comments Reviewed PLB technique with pt.  Talked about how it works and it's importance in maintaining their exercise saturations.    Goals/Expected Outcomes Short: Become more profiecient at using PLB. Long: Become independent at using PLB.          Initial Exercise Prescription:  Initial Exercise Prescription - 05/17/24 1500       Date of Initial Exercise RX and Referring Provider   Date 05/17/24    Referring Provider Ziglar, Susan, MD      Oxygen   Maintain Oxygen Saturation 88% or higher      Recumbant Bike   Level 1    RPM 50    Watts 15    Minutes 15    METs 0.14      NuStep   Level 1    SPM 80    Minutes 15    METs 0.14      Biostep-RELP   Level 1    SPM 50    Minutes 15    METs 0.14      Track   Laps 12    Minutes 15    METs 1.65      Prescription Details    Frequency (times per week) 2    Duration Progress to 30 minutes of continuous aerobic without signs/symptoms of physical distress      Intensity   THRR 40-80% of Max Heartrate 93-119    Ratings of Perceived Exertion 11-13    Perceived Dyspnea 0-4      Progression   Progression Continue to progress workloads to maintain intensity without signs/symptoms of physical distress.      Resistance Training   Training Prescription Yes    Weight 3 lb    Reps 10-15          Perform Capillary Blood Glucose checks as needed.  Exercise Prescription Changes:   Exercise Prescription Changes     Row Name 05/17/24 1500 06/08/24 0700           Response to Exercise   Blood Pressure (Admit) 128/70 120/76      Blood Pressure (Exercise) 144/80 122/68      Blood Pressure (Exit) 138/62 124/60      Heart Rate (Admit) 68 bpm 67 bpm      Heart Rate (Exercise) 84 bpm 72 bpm      Heart Rate (Exit) 69 bpm 71 bpm      Oxygen Saturation (Admit) 94 % 92 %      Oxygen Saturation (Exercise) 92 % 90 %      Oxygen Saturation (Exit) 94 % 93 %      Rating of Perceived Exertion (Exercise) 10 12      Perceived Dyspnea (Exercise) 0 1      Symptoms none none      Comments results 1st day of exercise      Duration -- Progress to 30 minutes of  aerobic without signs/symptoms of physical distress      Intensity -- THRR unchanged        Progression   Progression -- Continue to progress workloads to maintain intensity without signs/symptoms of physical  distress.      Average METs 0.14 1.39        Resistance Training   Training Prescription -- Yes      Weight -- 3 lb      Reps -- 10-15        Interval Training   Interval Training -- No        NuStep   Level -- 1      Minutes -- 15      METs -- 1.5        Track   Laps -- 6  hallway      Minutes -- 15      METs -- 1.27        Oxygen   Maintain Oxygen Saturation -- 88% or higher         Exercise Comments:   Exercise Comments     Row  Name 05/29/24 1117           Exercise Comments First full day of exercise!  Patient was oriented to gym and equipment including functions, settings, policies, and procedures.  Patient's individual exercise prescription and treatment plan were reviewed.  All starting workloads were established based on the results of the 6 minute walk test done at initial orientation visit.  The plan for exercise progression was also introduced and progression will be customized based on patient's performance and goals.          Exercise Goals and Review:   Exercise Goals     Row Name 05/17/24 1554             Exercise Goals   Increase Physical Activity Yes       Intervention Provide advice, education, support and counseling about physical activity/exercise needs.;Develop an individualized exercise prescription for aerobic and resistive training based on initial evaluation findings, risk stratification, comorbidities and participant's personal goals.       Expected Outcomes Short Term: Attend rehab on a regular basis to increase amount of physical activity.;Long Term: Add in home exercise to make exercise part of routine and to increase amount of physical activity.;Long Term: Exercising regularly at least 3-5 days a week.       Increase Strength and Stamina Yes       Intervention Provide advice, education, support and counseling about physical activity/exercise needs.;Develop an individualized exercise prescription for aerobic and resistive training based on initial evaluation findings, risk stratification, comorbidities and participant's personal goals.       Expected Outcomes Short Term: Increase workloads from initial exercise prescription for resistance, speed, and METs.;Short Term: Perform resistance training exercises routinely during rehab and add in resistance training at home;Long Term: Improve cardiorespiratory fitness, muscular endurance and strength as measured by increased METs and functional  capacity ( )       Able to understand and use rate of perceived exertion (RPE) scale Yes       Intervention Provide education and explanation on how to use RPE scale       Expected Outcomes Short Term: Able to use RPE daily in rehab to express subjective intensity level;Long Term:  Able to use RPE to guide intensity level when exercising independently       Able to understand and use Dyspnea scale Yes       Intervention Provide education and explanation on how to use Dyspnea scale       Expected Outcomes Short Term: Able to use Dyspnea scale daily in rehab to express subjective sense of shortness of  breath during exertion;Long Term: Able to use Dyspnea scale to guide intensity level when exercising independently       Knowledge and understanding of Target Heart Rate Range (THRR) Yes       Intervention Provide education and explanation of THRR including how the numbers were predicted and where they are located for reference       Expected Outcomes Short Term: Able to state/look up THRR;Short Term: Able to use daily as guideline for intensity in rehab;Long Term: Able to use THRR to govern intensity when exercising independently       Able to check pulse independently Yes       Intervention Provide education and demonstration on how to check pulse in carotid and radial arteries.;Review the importance of being able to check your own pulse for safety during independent exercise       Expected Outcomes Short Term: Able to explain why pulse checking is important during independent exercise;Long Term: Able to check pulse independently and accurately       Understanding of Exercise Prescription Yes       Intervention Provide education, explanation, and written materials on patient's individual exercise prescription       Expected Outcomes Short Term: Able to explain program exercise prescription;Long Term: Able to explain home exercise prescription to exercise independently          Exercise Goals  Re-Evaluation :  Exercise Goals Re-Evaluation     Row Name 05/29/24 1118 06/08/24 0750 06/19/24 1110         Exercise Goal Re-Evaluation   Exercise Goals Review Increase Physical Activity;Able to understand and use rate of perceived exertion (RPE) scale;Knowledge and understanding of Target Heart Rate Range (THRR);Understanding of Exercise Prescription;Increase Strength and Stamina;Able to understand and use Dyspnea scale;Able to check pulse independently Increase Physical Activity;Understanding of Exercise Prescription;Increase Strength and Stamina Increase Physical Activity;Understanding of Exercise Prescription;Increase Strength and Stamina     Comments Reviewed RPE and dyspnea scale, THR and program prescription with pt today.  Pt voiced understanding and was given a copy of goals to take home. Jama is off to a good start in the program and completed his first day in this review. He weas able to do level 1 on the T4 nustep and 6 laps on the hallway track. We will continue to monitor his progress in the program. Jama has not been to rehab since 9/8. We have been in contact with him and plans to let us  know of his status on 10/01.     Expected Outcomes Short: Use RPE daily to regulate intensity. Long: Follow program prescription in THR. Short: Continue to follow current exercise prescription. Long: Continue exercise to improve strength and stamina. Short: return to rehab. Long: Continue exercise to improve strength and stamina.        Discharge Exercise Prescription (Final Exercise Prescription Changes):  Exercise Prescription Changes - 06/08/24 0700       Response to Exercise   Blood Pressure (Admit) 120/76    Blood Pressure (Exercise) 122/68    Blood Pressure (Exit) 124/60    Heart Rate (Admit) 67 bpm    Heart Rate (Exercise) 72 bpm    Heart Rate (Exit) 71 bpm    Oxygen Saturation (Admit) 92 %    Oxygen Saturation (Exercise) 90 %    Oxygen Saturation (Exit) 93 %    Rating of Perceived  Exertion (Exercise) 12    Perceived Dyspnea (Exercise) 1    Symptoms none  Comments 1st day of exercise    Duration Progress to 30 minutes of  aerobic without signs/symptoms of physical distress    Intensity THRR unchanged      Progression   Progression Continue to progress workloads to maintain intensity without signs/symptoms of physical distress.    Average METs 1.39      Resistance Training   Training Prescription Yes    Weight 3 lb    Reps 10-15      Interval Training   Interval Training No      NuStep   Level 1    Minutes 15    METs 1.5      Track   Laps 6   hallway   Minutes 15    METs 1.27      Oxygen   Maintain Oxygen Saturation 88% or higher          Nutrition:  Target Goals: Understanding of nutrition guidelines, daily intake of sodium 1500mg , cholesterol 200mg , calories 30% from fat and 7% or less from saturated fats, daily to have 5 or more servings of fruits and vegetables.  Education: Nutrition 1 -Group instruction provided by verbal, written material, interactive activities, discussions, models, and posters to present general guidelines for heart healthy nutrition including macronutrients, label reading, and promoting whole foods over processed counterparts. Education serves as Pensions consultant of discussion of heart healthy eating for all. Written material provided at class time.     Education: Nutrition 2 -Group instruction provided by verbal, written material, interactive activities, discussions, models, and posters to present general guidelines for heart healthy nutrition including sodium, cholesterol, and saturated fat. Providing guidance of habit forming to improve blood pressure, cholesterol, and body weight. Written material provided at class time.     Biometrics:  Pre Biometrics - 05/17/24 1554       Pre Biometrics   Height 5' 6.61 (1.692 m)    Weight 227 lb 6.4 oz (103.1 kg)    Waist Circumference 50 inches    Hip Circumference 50  inches    Waist to Hip Ratio 1 %    BMI (Calculated) 36.03    Single Leg Stand 2.5 seconds           Nutrition Therapy Plan and Nutrition Goals:  Nutrition Therapy & Goals - 05/17/24 1555       Personal Nutrition Goals   Nutrition Goal Will meet w/ RD on 9/8      Intervention Plan   Intervention Prescribe, educate and counsel regarding individualized specific dietary modifications aiming towards targeted core components such as weight, hypertension, lipid management, diabetes, heart failure and other comorbidities.    Expected Outcomes Short Term Goal: Understand basic principles of dietary content, such as calories, fat, sodium, cholesterol and nutrients.          Nutrition Assessments:  MEDIFICTS Score Key: >=70 Need to make dietary changes  40-70 Heart Healthy Diet <= 40 Therapeutic Level Cholesterol Diet  Flowsheet Row Pulmonary Rehab from 05/29/2024 in [facility] Cardiac and Pulmonary Rehab  Picture Your Plate Total Score on Admission 40   Picture Your Plate Scores: <59 Unhealthy dietary pattern with much room for improvement. 41-50 Dietary pattern unlikely to meet recommendations for good health and room for improvement. 51-60 More healthful dietary pattern, with some room for improvement.  >60 Healthy dietary pattern, although there may be some specific behaviors that could be improved.   Nutrition Goals Re-Evaluation:   Nutrition Goals Discharge (Final Nutrition Goals Re-Evaluation):   Psychosocial: Target  Goals: Acknowledge presence or absence of significant depression and/or stress, maximize coping skills, provide positive support system. Participant is able to verbalize types and ability to use techniques and skills needed for reducing stress and depression.   Education: Stress, Anxiety, and Depression - Group verbal and visual presentation to define topics covered.  Reviews how body is impacted by stress, anxiety, and depression.  Also discusses healthy ways to  reduce stress and to treat/manage anxiety and depression.  Written material provided at class time.   Education: Sleep Hygiene -Provides group verbal and written instruction about how sleep can affect your health.  Define sleep hygiene, discuss sleep cycles and impact of sleep habits. Review good sleep hygiene tips.    Initial Review & Psychosocial Screening:  Initial Psych Review & Screening - 05/10/24 1349       Initial Review   Current issues with Current Anxiety/Panic;Current Psychotropic Meds      Family Dynamics   Good Support System? Yes    Comments Jama states he has some anxiety from being couped up in his house. He can look to victoria his care giver/daugher and her husband.      Barriers   Psychosocial barriers to participate in program The patient should benefit from training in stress management and relaxation.      Screening Interventions   Interventions Encouraged to exercise;To provide support and resources with identified psychosocial needs;Provide feedback about the scores to participant    Expected Outcomes Short Term goal: Utilizing psychosocial counselor, staff and physician to assist with identification of specific Stressors or current issues interfering with healing process. Setting desired goal for each stressor or current issue identified.;Long Term Goal: Stressors or current issues are controlled or eliminated.;Short Term goal: Identification and review with participant of any Quality of Life or Depression concerns found by scoring the questionnaire.;Long Term goal: The participant improves quality of Life and PHQ9 Scores as seen by post scores and/or verbalization of changes          Quality of Life Scores:  Scores of 19 and below usually indicate a poorer quality of life in these areas.  A difference of  2-3 points is a clinically meaningful difference.  A difference of 2-3 points in the total score of the Quality of Life Index has been associated with  significant improvement in overall quality of life, self-image, physical symptoms, and general health in studies assessing change in quality of life.  PHQ-9: Review Flowsheet       05/17/2024 10/12/2023  Depression screen PHQ 2/9  Decreased Interest 3 2  Down, Depressed, Hopeless 0 2  PHQ - 2 Score 3 4  Altered sleeping 1 2  Tired, decreased energy 2 3  Change in appetite 0 0  Feeling bad or failure about yourself  0 0  Trouble concentrating 0 0  Moving slowly or fidgety/restless 0 1  Suicidal thoughts 0 0  PHQ-9 Score 6 10  Difficult doing work/chores Not difficult at all Somewhat difficult   Interpretation of Total Score  Total Score Depression Severity:  1-4 = Minimal depression, 5-9 = Mild depression, 10-14 = Moderate depression, 15-19 = Moderately severe depression, 20-27 = Severe depression   Psychosocial Evaluation and Intervention:  Psychosocial Evaluation - 05/10/24 1353       Psychosocial Evaluation & Interventions   Interventions Encouraged to exercise with the program and follow exercise prescription;Relaxation education;Stress management education    Comments Jama states he has some anxiety from being couped up in his  house. He can look to victoria his care giver and her husband.    Expected Outcomes Short: Start [facility] to help with mood. Long: Maintain a healthy mental state    Continue Psychosocial Services  Follow up required by staff          Psychosocial Re-Evaluation:   Psychosocial Discharge (Final Psychosocial Re-Evaluation):   Education: Education Goals: Education classes will be provided on a weekly basis, covering required topics. Participant will state understanding/return demonstration of topics presented.  Learning Barriers/Preferences:  Learning Barriers/Preferences - 05/10/24 1349       Learning Barriers/Preferences   Learning Barriers Hearing;Sight    Learning Preferences None          General Pulmonary Education  Topics:  Infection Prevention: - Provides verbal and written material to individual with discussion of infection control including proper hand washing and proper equipment cleaning during exercise session. Flowsheet Row Pulmonary Rehab from 05/17/2024 in [facility] Cardiac and Pulmonary Rehab  Date 05/17/24  Educator MB  Instruction Review Code 1- Verbalizes Understanding    Falls Prevention: - Provides verbal and written material to individual with discussion of falls prevention and safety. Flowsheet Row Pulmonary Rehab from 05/17/2024 in [facility] Cardiac and Pulmonary Rehab  Date 05/17/24  Educator MB  Instruction Review Code 1- Verbalizes Understanding    Chronic Lung Disease Review: - Group verbal instruction with posters, models, PowerPoint presentations and videos,  to review new updates, new respiratory medications, new advancements in procedures and treatments. Providing information on websites and 800 numbers for continued self-education. Includes information about supplement oxygen, available portable oxygen systems, continuous and intermittent flow rates, oxygen safety, concentrators, and Medicare reimbursement for oxygen. Explanation of Pulmonary Drugs, including class, frequency, complications, importance of spacers, rinsing mouth after steroid MDI's, and proper cleaning methods for nebulizers. Review of basic lung anatomy and physiology related to function, structure, and complications of lung disease. Review of risk factors. Discussion about methods for diagnosing sleep apnea and types of masks and machines for OSA. Includes a review of the use of types of environmental controls: home humidity, furnaces, filters, dust mite/pet prevention, HEPA vacuums. Discussion about weather changes, air quality and the benefits of nasal washing. Instruction on Warning signs, infection symptoms, calling MD promptly, preventive modes, and value of vaccinations. Review of effective airway clearance, coughing  and/or vibration techniques. Emphasizing that all should Create an Action Plan. Written material provided at class time. Flowsheet Row Pulmonary Rehab from 05/29/2024 in [facility] Cardiac and Pulmonary Rehab  Education need identified 05/29/24    AED/CPR: - Group verbal and written instruction with the use of models to demonstrate the basic use of the AED with the basic ABC's of resuscitation.    Tests and Procedures:  - Group verbal and visual presentation and models provide information about basic cardiac anatomy and function. Reviews the testing methods done to diagnose heart disease and the outcomes of the test results. Describes the treatment choices: Medical Management, Angioplasty, or Coronary Bypass Surgery for treating various heart conditions including Myocardial Infarction, Angina, Valve Disease, and Cardiac Arrhythmias.  Written material provided at class time.   Medication Safety: - Group verbal and visual instruction to review commonly prescribed medications for heart and lung disease. Reviews the medication, class of the drug, and side effects. Includes the steps to properly store meds and maintain the prescription regimen.  Written material given at graduation.   Other: -Provides group and verbal instruction on various topics (see comments)   Knowledge Questionnaire Score:  Knowledge Questionnaire Score - 05/29/24 1708       Knowledge Questionnaire Score   Pre Score 9/18           Core Components/Risk Factors/Patient Goals at Admission:  Personal Goals and Risk Factors at Admission - 05/17/24 1555       Core Components/Risk Factors/Patient Goals on Admission    Weight Management Yes;Weight Loss    Intervention Weight Management: Provide education and appropriate resources to help participant work on and attain dietary goals.;Weight Management/Obesity: Establish reasonable short term and long term weight goals.;Weight Management: Develop a combined nutrition and  exercise program designed to reach desired caloric intake, while maintaining appropriate intake of nutrient and fiber, sodium and fats, and appropriate energy expenditure required for the weight goal.    Admit Weight 227 lb 6.4 oz (103.1 kg)    Goal Weight: Short Term 208 lb (94.3 kg)    Goal Weight: Long Term 190 lb (86.2 kg)    Expected Outcomes Short Term: Continue to assess and modify interventions until short term weight is achieved;Long Term: Adherence to nutrition and physical activity/exercise program aimed toward attainment of established weight goal;Weight Loss: Understanding of general recommendations for a balanced deficit meal plan, which promotes 1-2 lb weight loss per week and includes a negative energy balance of [phone] kcal/d;Understanding of distribution of calorie intake throughout the day with the consumption of 4-5 meals/snacks;Understanding recommendations for meals to include 15-35% energy as protein, 25-35% energy from fat, 35-60% energy from carbohydrates, less than 200mg  of dietary cholesterol, 20-35 gm of total fiber daily    Improve shortness of breath with ADL's Yes    Intervention Provide education, individualized exercise plan and daily activity instruction to help decrease symptoms of SOB with activities of daily living.    Expected Outcomes Short Term: Improve cardiorespiratory fitness to achieve a reduction of symptoms when performing ADLs;Long Term: Be able to perform more ADLs without symptoms or delay the onset of symptoms    Hypertension Yes    Intervention Provide education on lifestyle modifcations including regular physical activity/exercise, weight management, moderate sodium restriction and increased consumption of fresh fruit, vegetables, and low fat dairy, alcohol moderation, and smoking cessation.;Monitor prescription use compliance.    Expected Outcomes Short Term: Continued assessment and intervention until BP is < 140/20mm HG in hypertensive participants.  < 130/20mm HG in hypertensive participants with diabetes, heart failure or chronic kidney disease.;Long Term: Maintenance of blood pressure at goal levels.    Lipids Yes    Intervention Provide education and support for participant on nutrition & aerobic/resistive exercise along with prescribed medications to achieve LDL 70mg , HDL >40mg .    Expected Outcomes Short Term: Participant states understanding of desired cholesterol values and is compliant with medications prescribed. Participant is following exercise prescription and nutrition guidelines.;Long Term: Cholesterol controlled with medications as prescribed, with individualized exercise RX and with personalized nutrition plan. Value goals: LDL < 70mg , HDL > 40 mg.          Education:Diabetes - Individual verbal and written instruction to review signs/symptoms of diabetes, desired ranges of glucose level fasting, after meals and with exercise. Acknowledge that pre and post exercise glucose checks will be done for 3 sessions at entry of program.   Know Your Numbers and Heart Failure: - Group verbal and visual instruction to discuss disease risk factors for cardiac and pulmonary disease and treatment options.  Reviews associated critical values for Overweight/Obesity, Hypertension, Cholesterol, and Diabetes.  Discusses basics of heart failure: signs/symptoms  and treatments.  Introduces Heart Failure Zone chart for action plan for heart failure. Written material provided at class time.   Core Components/Risk Factors/Patient Goals Review:    Core Components/Risk Factors/Patient Goals at Discharge (Final Review):    ITP Comments:  ITP Comments     Row Name 05/10/24 1400 05/17/24 1549 05/29/24 1117 06/14/24 1037 07/05/24 0859   ITP Comments Virtual Visit completed. Patient informed on EP and RD appointment and 6 Minute walk test. Patient also informed of patient health questionnaires on My Chart. Patient Verbalizes understanding. Visit  diagnosis can be found in [facility] 01/11/2024. Completed and gym orientation for respiratory care services. Initial ITP created and sent for review to Dr. Faud Aleskerov, Medical Director. First full day of exercise!  Patient was oriented to gym and equipment including functions, settings, policies, and procedures.  Patient's individual exercise prescription and treatment plan were reviewed.  All starting workloads were established based on the results of the 6 minute walk test done at initial orientation visit.  The plan for exercise progression was also introduced and progression will be customized based on patient's performance and goals. 30 Day review completed. Medical Director ITP review done; changes made as directed and signed approval by Medical Director. New to program. Early Discharge due to lack of attendance      Comments: Early Discharge

## 2024-07-10 ENCOUNTER — Ambulatory Visit

## 2024-07-12 ENCOUNTER — Ambulatory Visit

## 2024-07-17 ENCOUNTER — Ambulatory Visit

## 2024-07-19 ENCOUNTER — Ambulatory Visit

## 2024-07-20 ENCOUNTER — Encounter: Payer: Self-pay | Admitting: Cardiovascular Disease

## 2024-07-20 ENCOUNTER — Ambulatory Visit: Admitting: Cardiovascular Disease

## 2024-07-20 VITALS — BP 126/72 | HR 59 | Ht 65.0 in | Wt 218.4 lb

## 2024-07-20 DIAGNOSIS — Z951 Presence of aortocoronary bypass graft: Secondary | ICD-10-CM

## 2024-07-20 DIAGNOSIS — E118 Type 2 diabetes mellitus with unspecified complications: Secondary | ICD-10-CM | POA: Diagnosis not present

## 2024-07-20 DIAGNOSIS — I2510 Atherosclerotic heart disease of native coronary artery without angina pectoris: Secondary | ICD-10-CM

## 2024-07-20 DIAGNOSIS — R0602 Shortness of breath: Secondary | ICD-10-CM

## 2024-07-20 DIAGNOSIS — I10 Essential (primary) hypertension: Secondary | ICD-10-CM | POA: Diagnosis not present

## 2024-07-20 NOTE — Progress Notes (Signed)
 Cardiology Office Note   Date:  07/20/2024   ID:  Jeffery Ward, DOB [birth year], MRN [id number]  PCP:  No primary care provider on file.  Cardiologist:  Denyse Bathe, MD      History of Present Illness: Jeffery Ward is a 89 y.o. male who presents for  Chief Complaint  Patient presents with   Follow-up    Follow up    Complaining of arthritis, no chest pain or SOB.      Past Medical History:  Diagnosis Date   Anxiety    Arthritis    back   Balanitis    BPH without obstruction/lower urinary tract symptoms    Coronary artery arteriosclerosis    Erectile dysfunction    GERD (gastroesophageal reflux disease)    Hearing difficulty    Hematuria    Hx of CABG    Hypogonadism in male    Kidney stones    Microscopic hematuria    Obesity    Primary hypertension    Urinary urgency      Past Surgical History:  Procedure Laterality Date   CATARACT EXTRACTION W/PHACO Left 03/08/2024   Procedure: PHACOEMULSIFICATION, CATARACT, WITH IOL INSERTION 7.14 00:43.5;  Surgeon: Mittie Gaskin, MD;  Location: [facility];  Service: Ophthalmology;  Laterality: Left;   CATARACT EXTRACTION W/PHACO Right 03/22/2024   Procedure: PHACOEMULSIFICATION, CATARACT, WITH IOL INSERTION 8.40 00:37.2;  Surgeon: Mittie Gaskin, MD;  Location: [facility];  Service: Ophthalmology;  Laterality: Right;   CORONARY ARTERY BYPASS GRAFT  2011   CYSTOSCOPY WITH HOLMIUM LASER LITHOTRIPSY  12/09/2021   Procedure: CYSTOSCOPY WITH HOLMIUM LASER LITHOTRIPSY;  Surgeon: Twylla Glendia BROCKS, MD;  Location: [facility];  Service: Urology;;   CYSTOSCOPY WITH LITHOLAPAXY N/A 12/09/2021   Procedure: CYSTOSCOPY WITH LITHOLAPAXY;  Surgeon: Twylla Glendia BROCKS, MD;  Location: [facility];  Service: Urology;  Laterality: N/A;   CYSTOSCOPY WITH STENT PLACEMENT Left 11/11/2021   Procedure: CYSTOSCOPY WITH STENT PLACEMENT;  Surgeon: Twylla Glendia BROCKS, MD;  Location: [facility];  Service: Urology;  Laterality:  Left;   CYSTOSCOPY/RETROGRADE/URETEROSCOPY/STONE EXTRACTION WITH BASKET  12/09/2021   Procedure: CYSTOSCOPY/RETROGRADE/URETEROSCOPY/STONE EXTRACTION WITH BASKET;  Surgeon: Twylla Glendia BROCKS, MD;  Location: [facility];  Service: Urology;;   LITHOTRIPSY       Current Outpatient Medications  Medication Sig Dispense Refill   acetaminophen  (TYLENOL ) 650 MG CR tablet Take 650 mg by mouth every 8 (eight) hours as needed for pain.     busPIRone  (BUSPAR ) 5 MG tablet May take TID PRN anxiety 90 tablet 3   Cholecalciferol (D3-1000 PO) Take by mouth.     clopidogrel  (PLAVIX ) 75 MG tablet Take 1 tablet (75 mg total) by mouth daily. 90 tablet 1   Coenzyme Q10 (COQ10) 200 MG CAPS Take 200 mg by mouth daily.     ferrous sulfate  324 (65 Fe) MG TBEC Take by mouth.     fluticasone  (FLONASE ) 50 MCG/ACT nasal spray Place 2 sprays into both nostrils daily. 11.1 mL 3   metoprolol  succinate (TOPROL -XL) 25 MG 24 hr tablet Take 1 tablet (25 mg total) by mouth daily. 90 tablet 0   nystatin  cream (MYCOSTATIN ) Apply 1 Application topically 2 (two) times daily. 30 g 1   pantoprazole  (PROTONIX ) 40 MG tablet Take 1 tablet (40 mg total) by mouth daily. 90 tablet 0   potassium citrate  (UROCIT-K ) 10 MEQ (1080 MG) SR tablet Take 1 tablet (10 mEq total) by mouth daily. 30 tablet 3   rosuvastatin  (CRESTOR )  20 MG tablet Take 1 tablet (20 mg total) by mouth at bedtime. 90 tablet 3   vitamin C (ASCORBIC ACID ) 250 MG tablet Take 500 mg by mouth daily.     zinc gluconate 50 MG tablet Take 50 mg by mouth daily.     aspirin  81 MG EC tablet Take 81 mg by mouth daily. Swallow whole. (Patient not taking: Reported on 07/20/2024)     diphenhydramine-acetaminophen  (TYLENOL  PM) 25-500 MG TABS tablet Take 1 tablet by mouth at bedtime as needed. (Patient not taking: Reported on 07/20/2024)     ketorolac  (ACULAR ) 0.5 % ophthalmic solution Place 1 drop into the right eye 4 (four) times daily. (Patient not taking: Reported on 07/20/2024)      moxifloxacin  (VIGAMOX ) 0.5 % ophthalmic solution Place 1 drop into the right eye 4 (four) times daily. (Patient not taking: Reported on 07/20/2024)     prednisoLONE acetate (PRED FORTE) 1 % ophthalmic suspension Place 1 drop into the right eye 4 (four) times daily. (Patient not taking: Reported on 07/20/2024)     No current facility-administered medications for this visit.    Allergies:   Amoxicillin, Azithromycin, Penicillins, and Quinapril    Social History:   reports that he has never smoked. He has never been exposed to tobacco smoke. He does not have any smokeless tobacco history on file. He reports that he does not drink alcohol and does not use drugs.   Family History:  family history includes Pneumonia in his father and mother.    ROS:     Review of Systems  Constitutional: Negative.   HENT: Negative.    Eyes: Negative.   Respiratory: Negative.    Gastrointestinal: Negative.   Genitourinary: Negative.   Musculoskeletal: Negative.   Skin: Negative.   Neurological: Negative.   Endo/Heme/Allergies: Negative.   Psychiatric/Behavioral: Negative.    All other systems reviewed and are negative.     All other systems are reviewed and negative.    PHYSICAL EXAM: VS:  BP 126/72   Pulse (!) 59   Ht 5' 5 (1.651 m)   Wt 218 lb 6.4 oz (99.1 kg)   SpO2 96%   BMI 36.34 kg/m  , BMI Body mass index is 36.34 kg/m. Last weight:  Wt Readings from Last 3 Encounters:  07/20/24 218 lb 6.4 oz (99.1 kg)  05/20/24 227 lb 4.7 oz (103.1 kg)  05/17/24 227 lb 6.4 oz (103.1 kg)     Physical Exam Vitals reviewed.  Constitutional:      Appearance: Normal appearance. He is normal weight.  HENT:     Head: Normocephalic.     Nose: Nose normal.     Mouth/Throat:     Mouth: Mucous membranes are moist.  Eyes:     Pupils: Pupils are equal, round, and reactive to light.  Cardiovascular:     Rate and Rhythm: Normal rate and regular rhythm.     Pulses: Normal pulses.     Heart sounds:  Normal heart sounds.  Pulmonary:     Effort: Pulmonary effort is normal.  Abdominal:     General: Abdomen is flat. Bowel sounds are normal.  Musculoskeletal:        General: Normal range of motion.     Cervical back: Normal range of motion.  Skin:    General: Skin is warm.  Neurological:     General: No focal deficit present.     Mental Status: He is alert.  Psychiatric:  Mood and Affect: Mood normal.       EKG:   Recent Labs: 04/28/2024: TSH 2.050 05/20/2024: ALT 20; B Natriuretic Peptide 61.3; BUN 15; Creatinine, Ser 1.01; Hemoglobin 13.3; Platelets 179; Potassium 3.7; Sodium 139    Lipid Panel    Component Value Date/Time   CHOL 164 04/28/2024 0911   TRIG 74 04/28/2024 0911   HDL 64 04/28/2024 0911   CHOLHDL 2.6 04/28/2024 0911   LDLCALC 86 04/28/2024 0911      Other studies Reviewed: Additional studies/ records that were reviewed today include:  Review of the above records demonstrates:       No data to display            ASSESSMENT AND PLAN:    ICD-10-CM   1. SOB (shortness of breath)  R06.02    Advise do exercise as stable cardiac point of view.    2. Primary hypertension  I10     3. Coronary artery disease involving native coronary artery of native heart without angina pectoris  I25.10     4. Hx of CABG  Z95.1     5. Controlled type 2 diabetes mellitus with complication, without long-term current use of insulin (HCC)  E11.8        Problem List Items Addressed This Visit       Cardiovascular and Mediastinum   Coronary artery disease involving native coronary artery of native heart   Primary hypertension     Other   Hx of CABG   SOB (shortness of breath) - Primary   Other Visit Diagnoses       Controlled type 2 diabetes mellitus with complication, without long-term current use of insulin (HCC)              Disposition:   Return in about 3 months (around 10/20/2024).    Total time spent: 30 minutes  Signed,  Denyse Bathe, MD  07/20/2024 1:23 PM    [facility]

## 2024-07-24 ENCOUNTER — Ambulatory Visit

## 2024-07-24 ENCOUNTER — Ambulatory Visit: Admitting: Family Medicine

## 2024-07-26 ENCOUNTER — Ambulatory Visit

## 2024-07-31 ENCOUNTER — Ambulatory Visit

## 2024-08-02 ENCOUNTER — Ambulatory Visit

## 2024-08-07 ENCOUNTER — Ambulatory Visit

## 2024-08-09 ENCOUNTER — Ambulatory Visit

## 2024-08-14 ENCOUNTER — Ambulatory Visit

## 2024-08-16 ENCOUNTER — Ambulatory Visit

## 2024-08-21 ENCOUNTER — Ambulatory Visit

## 2024-08-23 ENCOUNTER — Ambulatory Visit

## 2024-08-28 ENCOUNTER — Ambulatory Visit

## 2024-08-30 ENCOUNTER — Ambulatory Visit

## 2024-08-31 ENCOUNTER — Other Ambulatory Visit: Payer: Self-pay | Admitting: Family Medicine

## 2024-08-31 DIAGNOSIS — T7840XD Allergy, unspecified, subsequent encounter: Secondary | ICD-10-CM

## 2024-09-04 ENCOUNTER — Ambulatory Visit

## 2024-09-06 ENCOUNTER — Ambulatory Visit

## 2024-09-11 ENCOUNTER — Ambulatory Visit

## 2024-09-13 ENCOUNTER — Ambulatory Visit

## 2024-10-19 ENCOUNTER — Ambulatory Visit: Admitting: Cardiovascular Disease
